# Patient Record
Sex: Male | Born: 2000 | Race: White | Hispanic: No | Marital: Single | Smoking: Never smoker
Health system: Southern US, Community
[De-identification: ages and names within clinical notes are randomized; demographics above are authoritative.]

## PROBLEM LIST (undated history)

## (undated) DIAGNOSIS — J45909 Unspecified asthma, uncomplicated: Secondary | ICD-10-CM

## (undated) DIAGNOSIS — L309 Dermatitis, unspecified: Secondary | ICD-10-CM

## (undated) HISTORY — DX: Dermatitis, unspecified: L30.9

---
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
## 2019-01-24 ENCOUNTER — Other Ambulatory Visit: Payer: Self-pay | Admitting: Nurse Practitioner

## 2019-01-24 DIAGNOSIS — R0989 Other specified symptoms and signs involving the circulatory and respiratory systems: Secondary | ICD-10-CM

## 2019-02-08 ENCOUNTER — Ambulatory Visit
Admission: RE | Admit: 2019-02-08 | Discharge: 2019-02-08 | Disposition: A | Discharge status: Home / Self Care | Payer: BC Managed Care – PPO | Source: Ambulatory Visit | Attending: Nurse Practitioner | Admitting: Nurse Practitioner

## 2019-02-08 DIAGNOSIS — R0989 Other specified symptoms and signs involving the circulatory and respiratory systems: Secondary | ICD-10-CM

## 2020-01-12 IMAGING — US SOFT TISSUE ULTRASOUND HEAD/NECK
1 series · 5 of 5 positions shown · non-contrast
Comparison: None.

CLINICAL DATA: Left posterior irregular lymph node, 1 cm

EXAM:
ULTRASOUND OF HEAD/NECK SOFT TISSUES
TECHNIQUE: Ultrasound examination of the head and neck soft tissues was
performed in the area of clinical concern.

[Series 1: soft tissue ultrasound head/neck · 0.05mm/px · 5 acquisitions, 5 frames shown]
[im 1/5]
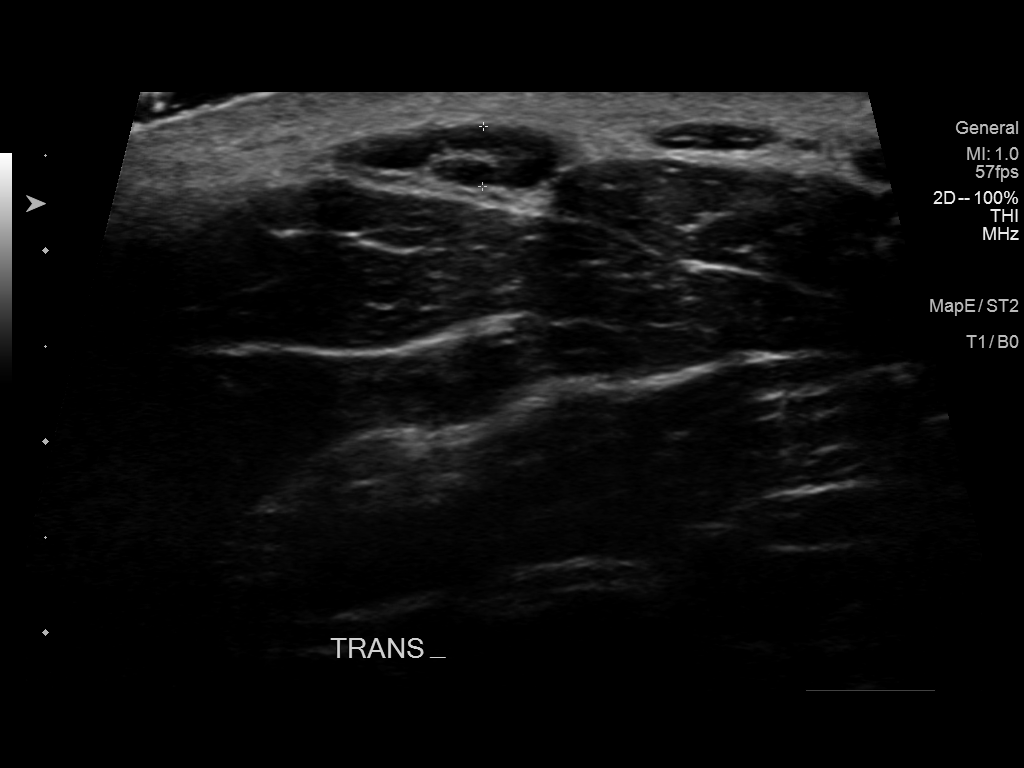
[im 2/5]
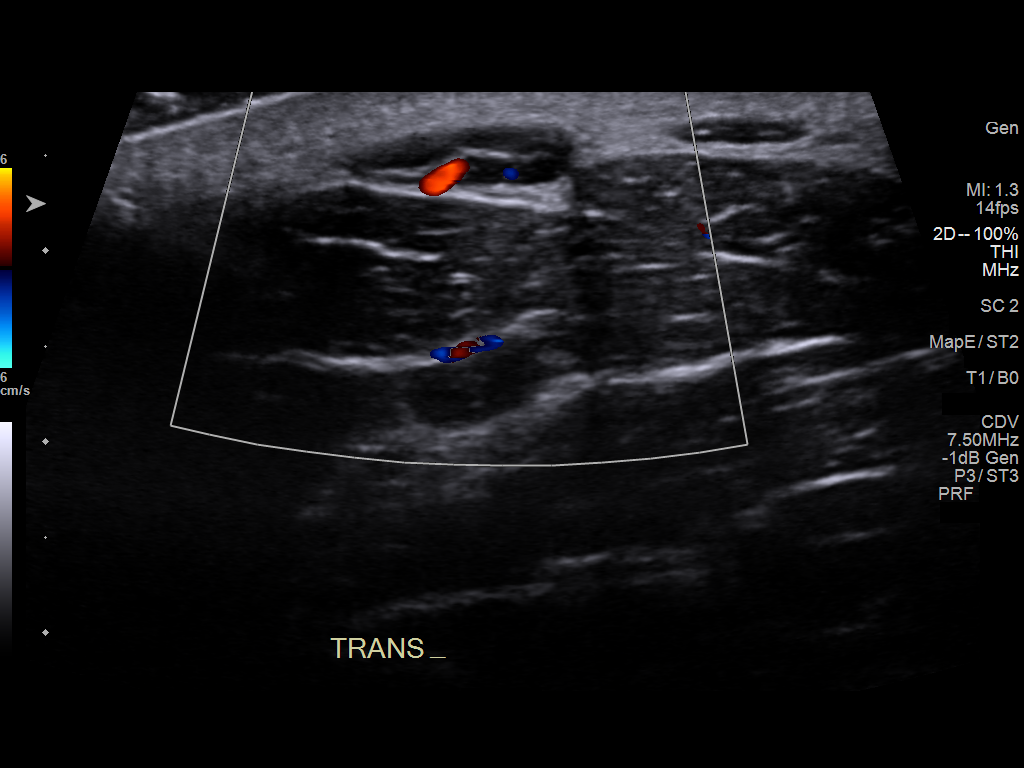
[im 3/5]
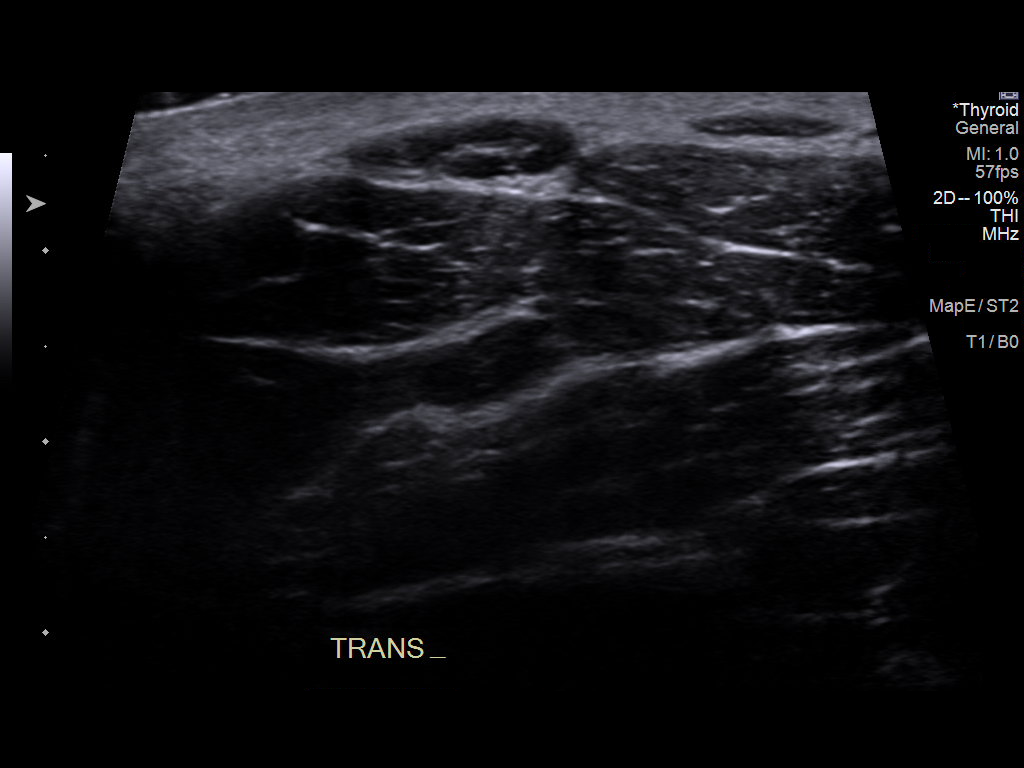
[im 4/5]
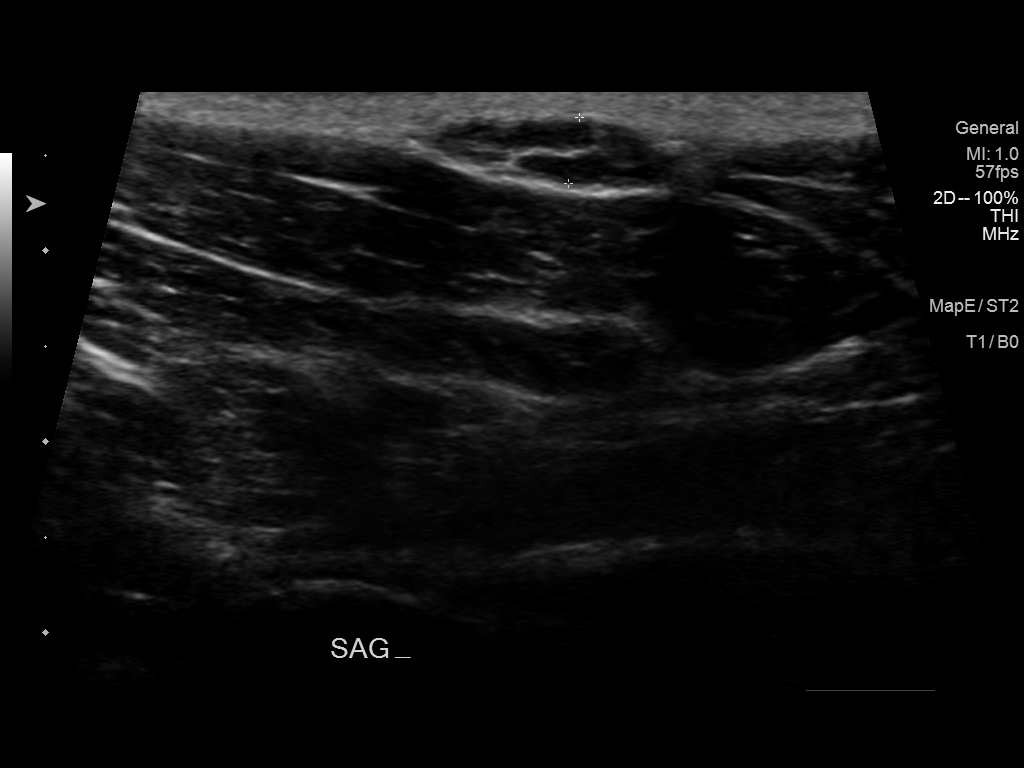
[im 5/5]
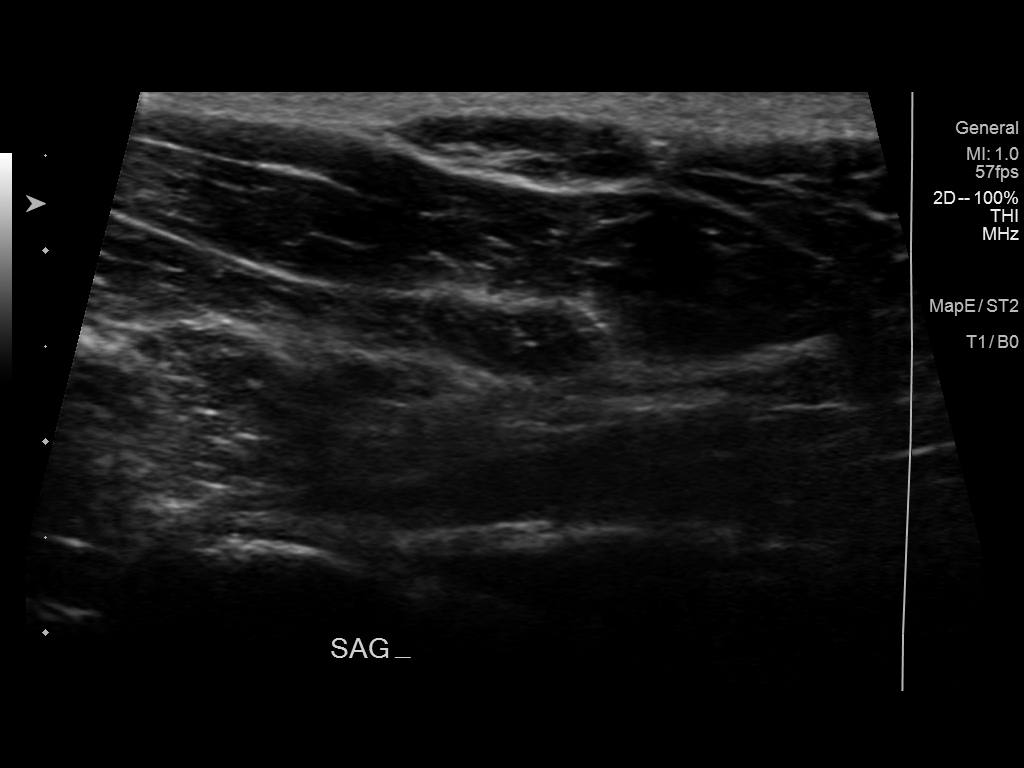

[5 of 5 positions shown; findings below may reference images not displayed]

FINDINGS: Palpable complaint reflects a normal morphology lymph node measuring
3 x 11 mm. No underlying inflammation or collection is seen.
Subjacent musculature and superficial fat is normal appearing.
IMPRESSION: The palpable complaint reflects a normal morphology lymph node
measuring 11 x 3 mm. No adjacent inflammatory changes are noted.

## 2020-09-04 ENCOUNTER — Other Ambulatory Visit: Payer: Self-pay

## 2020-09-04 ENCOUNTER — Ambulatory Visit: Payer: BC Managed Care – PPO | Admitting: Allergy

## 2020-09-04 ENCOUNTER — Encounter: Payer: Self-pay | Admitting: Allergy

## 2020-09-04 VITALS — BP 122/62 | HR 87 | Temp 97.9°F | Resp 18 | Ht 72.05 in | Wt 175.8 lb

## 2020-09-04 DIAGNOSIS — L2089 Other atopic dermatitis: Secondary | ICD-10-CM | POA: Diagnosis not present

## 2020-09-04 DIAGNOSIS — T781XXD Other adverse food reactions, not elsewhere classified, subsequent encounter: Secondary | ICD-10-CM | POA: Diagnosis not present

## 2020-09-04 DIAGNOSIS — J3089 Other allergic rhinitis: Secondary | ICD-10-CM | POA: Diagnosis not present

## 2020-09-04 DIAGNOSIS — Z8709 Personal history of other diseases of the respiratory system: Secondary | ICD-10-CM | POA: Diagnosis not present

## 2020-09-04 MED ORDER — EUCRISA 2 % EX OINT: 1.0000 "application " | TOPICAL_OINTMENT | Freq: Two times a day (BID) | CUTANEOUS | 5 refills | Status: DC

## 2020-09-04 MED ORDER — HYDROXYZINE HCL 10 MG PO TABS: ORAL_TABLET | ORAL | 2 refills | Status: DC

## 2020-09-04 MED ORDER — TRIAMCINOLONE ACETONIDE 0.1 % EX OINT: 1.0000 "application " | TOPICAL_OINTMENT | Freq: Two times a day (BID) | CUTANEOUS | 2 refills | Status: DC

## 2020-09-04 MED ORDER — TRIAMCINOLONE ACETONIDE 0.1 % EX OINT: 1.0000 "application " | TOPICAL_OINTMENT | Freq: Two times a day (BID) | CUTANEOUS | 2 refills | Status: DC | PRN

## 2020-09-04 NOTE — Progress Notes (Unsigned)
New Patient Note  RE: Adrian Lester MRN: [id number] DOB: August 05, 2000 Date of Office Visit: 09/04/2020  Referring provider: Verl Blalock Primary care provider: Marinda Elk, MD  Chief Complaint: Allergy Testing  History of Present Illness: I had the pleasure of seeing [name] for initial evaluation at the [facility] on 09/05/2020. He is a 20 y.o. male, who is referred here by Marinda Elk, MD for the evaluation of rash.  Rash started about 2-3 months ago. Mainly occurs on his armpits, shoulders, face. Describes them as dry skin, itchy, red, flat. No ecchymosis upon resolution. Associated symptoms include: none.  Had one episode of hives at first onset but that resolved after taking prednisone.   Suspected triggers are unknown. Denies any fevers, chills, changes in medications, foods, personal care products or recent infections. He has tried the following therapies: triamcinolone and Claritin with no benefit. Systemic steroids yes x 3 which helps and currently on a prednisone taper. Currently takes Claritin prn.  Currently using Aveeno products.  Previous work up includes: none. Previous history of rash/hives: history of eczema as a child.  Patient moved back home with parents last year and got a new dog during Christmas time as well.   Assessment and Plan: Mayer is a 20 y.o. male with: Other atopic dermatitis History of eczema as a child which flared significantly 2-3 months ago requiring 3 total courses of prednisone. Denies any changes in diet, meds or personal care products. They did get a new dog in December 2021.   Today's skin testing showed: Positive to grass, weed pollen, trees, mold, dust mites, cat, dog, cockroach, horse, ragweed. Borderline to mouse Positive to peanuts, wheat, casein, fish and cashew.  Discussed with patient the above allergens can definitely flare his eczema and oral prednisone is not good long term option.   He  did not notice any flares after consuming wheat, casein or fish - most likely an irrelevant sensitization due to his eczema. Okay to continue to consume.  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    Start Dupixent injections - will start prior authorization.  Let us know when ready to start.  See below for proper skin care - no tide pods for laundry detergent.  Medications: . Only apply to affected areas that are "rough and red" Face: May use Eucrisa (crisaborole) 2% ointment twice a day on mild eczema flares on the face and body. This is a non-steroid ointment. Samples given. If it burns, place the medication in the refrigerator.  Apply a thin layer of moisturizer and then apply the Eucrisa on top of it. Body:  . May use triamcinolone 0.1% ointment twice a day as needed for eczema flares. Do not use on the face, neck, armpits or groin area. Do not use more than 3 weeks in a row.  . Moisturizer: Triamcinolone-Eucerin twice a day. . For more than twice a day use the following: Aquaphor, Vaseline, Cerave, Cetaphil, Eucerin, Vanicream.  Itching: . Take Zyrtec 10mg  in the morning . Take hydroxyzine 10-20mg  1 hour before bed  Other allergic rhinitis Some rhinitis symptoms.  Today's skin testing showed: Positive to grass, weed pollen, trees, mold, dust mites, cat, dog, cockroach, horse, ragweed. Borderline to mouse  Start environmental control measures as below.  May use over the counter antihistamines such as Zyrtec (cetirizine), Claritin (loratadine), Allegra (fexofenadine), or Xyzal (levocetirizine) daily as needed.  Adverse reaction to food, subsequent encounter Throat tightness/closure after  peanut exposure in the past. Avoiding all peanuts and tree nuts. 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  Today's skin testing showed: Positive to peanuts, wheat, casein, fish and cashew.   The wheat, casein and fish are most likely an irrelevant sensitization due to his eczema.  Okay to continue to consume.  Continue strict avoidance of peanuts, tree nuts.   For mild symptoms you can take over the counter antihistamines such as Benadryl and monitor symptoms closely. If symptoms worsen or if you have severe symptoms including breathing issues, throat closure, significant swelling, whole body hives, severe diarrhea and vomiting, lightheadedness then inject epinephrine and seek immediate medical care afterwards.  Food action plan given.   History of asthma Asthma as a child. No inhaler use for the past few years.  Monitor symptoms.  Return in about 2 months (around 11/04/2020).  Meds ordered this encounter  Medications  . Crisaborole (EUCRISA) 2 % OINT    Sig: Apply 1 application topically 2 (two) times daily.    Dispense:  100 g    Refill:  5  . hydrOXYzine (ATARAX/VISTARIL) 10 MG tablet    Sig: Take 1 to 2 tablets 1 hour before bedtime as needed for itching    Dispense:  60 tablet    Refill:  2  . triamcinolone ointment (KENALOG) 0.1 %    Sig: Apply 1 application topically 2 (two) times daily. Use daily as moisturizer    Dispense:  453 g    Refill:  2    Mix 1:1 with Eucerin.  Marland Kitchen triamcinolone ointment (KENALOG) 0.1 %    Sig: Apply 1 application topically 2 (two) times daily as needed (eczema flare on the body). Do not use on the face, neck, armpits or groin area. Do not use more than 3 weeks in a row.    Dispense:  80 g    Refill:  2   Lab Orders  No laboratory test(s) ordered today    Other allergy screening: Asthma: yes as a child but no inhaler use for the past few years Rhino conjunctivitis: yes  Some rhinitis symptoms. Food allergy: yes  Peanuts caused throat closure in the past. Avoiding all peanuts and tree nuts.  Patient has Epipen and no prior use. No recent skin testing.  Patient eats wheat and dairy on a daily basis with no known reactions Eats fish a few times per month with no issues.  Medication allergy: no Hymenoptera allergy:  no History of recurrent infections suggestive of immunodeficency: no  Diagnostics: Skin Testing: Environmental allergy panel and select foods. Positive to grass, weed pollen, trees, mold, dust mites, cat, dog, cockroach, horse, ragweed. Borderline to mouse Positive to peanuts, wheat, casein, fish and cashew. Results discussed with patient/family.  Airborne Adult Perc - 09/04/20 1516    Time Antigen Placed 1516    Location Back    Number of Test 59    1. Control-Buffer 50% Glycerol Negative    2. Control-Histamine 1 mg/ml 2+    3. Albumin saline Negative    4. Bahia 3+    5. [place] 2+    6. Johnson 2+    7. Kentucky Blue 2+    8. Meadow Fescue 2+    9. Perennial Rye 2+    10. Sweet Vernal 2+    11. Timothy 3+    12. Cocklebur Negative    13. Burweed Marshelder Negative    14. Ragweed, short Negative    15. Ragweed, Giant Negative  16. Plantain,  English Negative    17. Lamb's Quarters Negative    18. Sheep Sorrell 2+    19. Rough Pigweed Negative    20. Marsh Elder, Rough Negative    21. Mugwort, Common 2+    22. Ash mix 2+    23. Birch mix 2+    24. Beech American Negative    25. Box, Elder 2+    26. Cedar, red 2+    27. Cottonwood, Guinea-[place]issau Negative    28. Elm mix 2+    29. Hickory 3+    30. Maple mix Negative    31. Oak, [place] mix 3+    32. Pecan Pollen 2+    33. Pine mix Negative    34. Sycamore Eastern 2+    35. Walnut, Black Pollen 2+    36. Alternaria alternata Negative    37. Cladosporium Herbarum 3+    38. Aspergillus mix Negative    39. Penicillium mix Negative    40. Bipolaris sorokiniana (Helminthosporium) Negative    41. Drechslera spicifera (Curvularia) Negative    42. Mucor plumbeus Negative    43. Fusarium moniliforme Negative    44. Aureobasidium pullulans (pullulara) Negative    45. Rhizopus oryzae Negative    46. Botrytis cinera Negative    47. Epicoccum nigrum Negative    48. Phoma betae Negative    49. Candida Albicans Negative     50. Trichophyton mentagrophytes Negative    51. Mite, D Farinae  5,000 AU/ml 4+    52. Mite, D Pteronyssinus  5,000 AU/ml 4+    53. Cat Hair 10,000 BAU/ml 3+    54.  Dog Epithelia 3+    55. Mixed Feathers Negative    56. Horse Epithelia 3+    57. Cockroach, German 2+    58. Mouse --   +/-   59. Tobacco Leaf Negative          Food Perc - 09/04/20 1516      Test Information   Time Antigen Placed 1516    Allergen Manufacturer Waynette Buttery    Location Back    Number of allergen test 10      Food   1. Peanut --   18x11   2. Soybean food Negative    3. Wheat, whole --   12x7   4. Sesame Negative    5. Milk, cow Negative    6. Egg White, chicken Negative    7. Casein --   8x6   8. Shellfish mix Negative    9. Fish mix --   13x8   10. Cashew --   19x9         Intradermal - 09/04/20 1553    Time Antigen Placed 1553    Allergen Manufacturer Waynette Buttery    Location Arm    Number of Test 5    Control Negative    Ragweed mix 2+    Mold 2 2+    Mold 3 Negative    Mold 4 2+           Past Medical History: Patient Active Problem List   Diagnosis Date Noted  . Other atopic dermatitis 09/05/2020  . Other allergic rhinitis 09/05/2020  . History of asthma 09/05/2020  . Adverse reaction to food, subsequent encounter 09/05/2020  . Skull fracture with concussion (HCC) 12/05/2014  . Head trauma 12/05/2014   Past Medical History:  Diagnosis Date  . Asthma    Past Surgical History: History reviewed.  No pertinent surgical history. Medication List:  Current Outpatient Medications  Medication Sig Dispense Refill  . albuterol (PROVENTIL HFA;VENTOLIN HFA) 108 (90 BASE) MCG/ACT inhaler Inhale 1-2 puffs into the lungs every 6 (six) hours as needed for wheezing. 1 Inhaler 1  . cetirizine (ZYRTEC) 10 MG tablet Take 10 mg by mouth daily.    Lennox Solders. Crisaborole (EUCRISA) 2 % OINT Apply 1 application topically 2 (two) times daily. 100 g 5  . EPINEPHrine 0.3 mg/0.3 mL IJ SOAJ injection See admin  instructions.    Marland Kitchen. escitalopram (LEXAPRO) 10 MG tablet Take 10 mg by mouth daily.    . hydrOXYzine (ATARAX/VISTARIL) 10 MG tablet Take 1 to 2 tablets 1 hour before bedtime as needed for itching 60 tablet 2  . ibuprofen (ADVIL,MOTRIN) 200 MG tablet Take 200 mg by mouth every 6 (six) hours as needed for fever or moderate pain.    . predniSONE (DELTASONE) 10 MG tablet Take by mouth.    . triamcinolone 0.1% oint-Eucerin equivalent cream 1:1 mixture See admin instructions.    . triamcinolone ointment (KENALOG) 0.1 % Apply 1 application topically 2 (two) times daily. Use daily as moisturizer 453 g 2  . triamcinolone ointment (KENALOG) 0.1 % Apply 1 application topically 2 (two) times daily as needed (eczema flare on the body). Do not use on the face, neck, armpits or groin area. Do not use more than 3 weeks in a row. 80 g 2   No current facility-administered medications for this visit.   Allergies: Allergies  Allergen Reactions  . Peanut-Containing Drug Products    Social History: Social History   Socioeconomic History  . Marital status: Single    Spouse name: Not on file  . Number of children: Not on file  . Years of education: Not on file  . Highest education level: Not on file  Occupational History  . Not on file  Tobacco Use  . Smoking status: Never Smoker  . Smokeless tobacco: Never Used  Substance and Sexual Activity  . Alcohol use: Not on file  . Drug use: Never  . Sexual activity: Not on file  Other Topics Concern  . Not on file  Social History Narrative  . Not on file   Social Determinants of Health   Financial Resource Strain: Not on file  Food Insecurity: Not on file  Transportation Needs: Not on file  Physical Activity: Not on file  Stress: Not on file  Social Connections: Not on file   Lives in a 21 year old house. Smoking: denies Occupation: Facilities managerwaiter  Environmental HistorySurveyor, minerals: Water Damage/mildew in the house: no Engineer, civil (consulting)Carpet in the family room: yes Carpet in the  bedroom: yes Heating: gas Cooling: central Pet: yes 2 dogs x 6 yrs, x few months, 1 cat x 7 yrs  Family History: Family History  Problem Relation Age of Onset  . Hypertension Father   . Heart disease Maternal Grandfather   . Hypertension Maternal Grandfather   . Heart disease Paternal Grandfather   . Hypertension Paternal Grandfather    Problem                               Relation Asthma                                   Mother, brother  Eczema  Mother  Food allergy                          no Allergic rhino conjunctivitis     no  Review of Systems  Constitutional: Negative for appetite change, chills, fever and unexpected weight change.  HENT: Negative for congestion and rhinorrhea.   Eyes: Negative for itching.  Respiratory: Negative for cough, chest tightness, shortness of breath and wheezing.   Cardiovascular: Negative for chest pain.  Gastrointestinal: Negative for abdominal pain.  Genitourinary: Negative for difficulty urinating.  Skin: Positive for rash.  Allergic/Immunologic: Positive for environmental allergies and food allergies.  Neurological: Negative for headaches.   Objective: BP 122/62 (BP Location: Right Arm, Patient Position: Sitting, Cuff Size: Normal)   Pulse 87   Temp 97.9 F (36.6 C) (Temporal)   Resp 18   Ht 6' 0.05" (1.83 m)   Wt 175 lb 12.8 oz (79.7 kg)   SpO2 97%   BMI 23.81 kg/m  Body mass index is 23.81 kg/m. Physical Exam Vitals and nursing note reviewed.  Constitutional:      Appearance: Normal appearance. He is well-developed.  HENT:     Head: Normocephalic and atraumatic.     Right Ear: Tympanic membrane and external ear normal.     Left Ear: Tympanic membrane and external ear normal.     Nose: Nose normal.     Mouth/Throat:     Mouth: Mucous membranes are moist.     Pharynx: Oropharynx is clear.  Eyes:     Conjunctiva/sclera: Conjunctivae normal.  Cardiovascular:     Rate and Rhythm: Normal  rate and regular rhythm.     Heart sounds: Normal heart sounds. No murmur heard. No friction rub. No gallop.   Pulmonary:     Effort: Pulmonary effort is normal.     Breath sounds: Normal breath sounds. No wheezing, rhonchi or rales.  Musculoskeletal:     Cervical back: Neck supple.  Skin:    General: Skin is warm and dry.     Findings: Rash present.     Comments: Very dry skin throughout with excoriation marks. Periorbital erythema b/l.  Neurological:     Mental Status: He is alert and oriented to person, place, and time.  Psychiatric:        Behavior: Behavior normal.    The plan was reviewed with the patient/family, and all questions/concerned were addressed.  It was my pleasure to see Adrian Lester today and participate in his care. Please feel free to contact me with any questions or concerns.  Sincerely,  Wyline Mood, DO Allergy & Immunology  [facility] office: [phone] [facility] office: [phone]

## 2020-09-04 NOTE — Patient Instructions (Addendum)
Today's skin testing showed: Positive to grass, weed pollen, trees, mold, dust mites, cat, dog, cockroach, horse, ragweed. Borderline to mouse Positive to peanuts, wheat, casein, fish and cashew.  Skin:   Start Dupixent injections - will start prior authorization.  Let us know when ready to start.  See below for proper skin care. Medications: . Only apply to affected areas that are "rough and red" Face: May use Eucrisa (crisaborole) 2% ointment twice a day on mild eczema flares on the face and body. This is a non-steroid ointment.  If it burns, place the medication in the refrigerator.  Apply a thin layer of moisturizer and then apply the Eucrisa on top of it. Body:  . May use triamcinolone 0.1% ointment twice a day as needed for eczema flares. Do not use on the face, neck, armpits or groin area. Do not use more than 3 weeks in a row.  . Moisturizer: Triamcinolone-Eucerin twice a day. . For more than twice a day use the following: Aquaphor, Vaseline, Cerave, Cetaphil, Eucerin, Vanicream.  Itching: . Take Zyrtec 10mg  in the morning . Take hydroxyzine 10-20mg  1 hour before bed  Environmental allergies  Start environmental control measures as below.  May use over the counter antihistamines such as Zyrtec (cetirizine), Claritin (loratadine), Allegra (fexofenadine), or Xyzal (levocetirizine) daily as needed.  Food:  Continue strict avoidance of peanuts, tree nuts.   For mild symptoms you can take over the counter antihistamines such as Benadryl and monitor symptoms closely. If symptoms worsen or if you have severe symptoms including breathing issues, throat closure, significant swelling, whole body hives, severe diarrhea and vomiting, lightheadedness then inject epinephrine and seek immediate medical care afterwards.  Food action plan given.   Follow up in 2 months or sooner if needed.    Skin care recommendations  Bath time: . Always use lukewarm water. AVOID very hot or cold  water. Keep bathing time to 5-10 minutes. . Do NOT use bubble bath. . Use a mild soap and use just enough to wash the dirty areas. . Do NOT scrub skin vigorously.  . After bathing, pat dry your skin with a towel. Do NOT rub or scrub the skin.  Moisturizers and prescriptions:  . ALWAYS apply moisturizers immediately after bathing (within 3 minutes). This helps to lock-in moisture. . Use the moisturizer several times a day over the whole body. Marland Kitchen summer moisturizers include: Aveeno, CeraVe, Cetaphil. Peri Jefferson winter moisturizers include: Aquaphor, Vaseline, Cerave, Cetaphil, Eucerin, Vanicream. . When using moisturizers along with medications, the moisturizer should be applied about one hour after applying the medication to prevent diluting effect of the medication or moisturize around where you applied the medications. When not using medications, the moisturizer can be continued twice daily as maintenance.  Laundry and clothing: . Avoid laundry products with added color or perfumes. . Use unscented hypo-allergenic laundry products such as Tide free, Cheer free & gentle, and All free and clear.  . If the skin still seems dry or sensitive, you can try double-rinsing the clothes. . Avoid tight or scratchy clothing such as wool. . Do not use fabric softeners or dyer sheets.  Reducing Pollen Exposure . Pollen seasons: trees (spring), grass (summer) and ragweed/weeds (fall). 11-06-1981 Keep windows closed in your home and car to lower pollen exposure.  Marland Kitchen air conditioning in the bedroom and throughout the house if possible.  . Avoid going out in dry windy days - especially early morning. . Pollen counts are highest between  5 - 10 AM and on dry, hot and windy days.  . Save outside activities for late afternoon or after a heavy rain, when pollen levels are lower.  . Avoid mowing of grass if you have grass pollen allergy. Marland Kitchen Be aware that pollen can also be transported indoors on people and pets.   . Dry your clothes in an automatic dryer rather than hanging them outside where they might collect pollen.  . Rinse hair and eyes before bedtime. Mold Control . Mold and fungi can grow on a variety of surfaces provided certain temperature and moisture conditions exist.  . Outdoor molds grow on plants, decaying vegetation and soil. The major outdoor mold, Alternaria and Cladosporium, are found in very high numbers during hot and dry conditions. Generally, a late summer - fall peak is seen for common outdoor fungal spores. Rain will temporarily lower outdoor mold spore count, but counts rise rapidly when the rainy period ends. . The most important indoor molds are Aspergillus and Penicillium. Dark, humid and poorly ventilated basements are ideal sites for mold growth. The next most common sites of mold growth are the bathroom and the kitchen. Outdoor (Seasonal) Mold Control . Use air conditioning and keep windows closed. . Avoid exposure to decaying vegetation. Marland Kitchen Avoid leaf raking. . Avoid grain handling. . Consider wearing a face mask if working in moldy areas.  Indoor (Perennial) Mold Control  . Maintain humidity below 50%. . Get rid of mold growth on hard surfaces with water, detergent and, if necessary, 5% bleach (do not mix with other cleaners). Then dry the area completely. If mold covers an area more than 10 square feet, consider hiring an indoor environmental professional. . For clothing, washing with soap and water is best. If moldy items cannot be cleaned and dried, throw them away. . Remove sources e.g. contaminated carpets. . Repair and seal leaking roofs or pipes. Using dehumidifiers in damp basements may be helpful, but empty the water and clean units regularly to prevent mildew from forming. All rooms, especially basements, bathrooms and kitchens, require ventilation and cleaning to deter mold and mildew growth. Avoid carpeting on concrete or damp floors, and storing items in damp  areas. Control of House Dust Mite Allergen . Dust mite allergens are a common trigger of allergy and asthma symptoms. While they can be found throughout the house, these microscopic creatures thrive in warm, humid environments such as bedding, upholstered furniture and carpeting. . Because so much time is spent in the bedroom, it is essential to reduce mite levels there.  . Encase pillows, mattresses, and box springs in special allergen-proof fabric covers or airtight, zippered plastic covers.  . Bedding should be washed weekly in hot water (130 F) and dried in a hot dryer. Allergen-proof covers are available for comforters and pillows that can't be regularly washed.  Reyes Ivan the allergy-proof covers every few months. Minimize clutter in the bedroom. Keep pets out of the bedroom.  Marland Kitchen Keep humidity less than 50% by using a dehumidifier or air conditioning. You can buy a humidity measuring device called a hygrometer to monitor this.  . If possible, replace carpets with hardwood, linoleum, or washable area rugs. If that's not possible, vacuum frequently with a vacuum that has a HEPA filter. . Remove all upholstered furniture and non-washable window drapes from the bedroom. . Remove all non-washable stuffed toys from the bedroom.  Wash stuffed toys weekly. Pet Allergen Avoidance: . Contrary to popular opinion, there are no "hypoallergenic" breeds  of dogs or cats. That is because people are not allergic to an animal's hair, but to an allergen found in the animal's saliva, dander (dead skin flakes) or urine. Pet allergy symptoms typically occur within minutes. For some people, symptoms can build up and become most severe 8 to 12 hours after contact with the animal. People with severe allergies can experience reactions in public places if dander has been transported on the pet owners' clothing. Marland Kitchen Keeping an animal outdoors is only a partial solution, since homes with pets in the yard still have higher  concentrations of animal allergens. . Before getting a pet, ask your allergist to determine if you are allergic to animals. If your pet is already considered part of your family, try to minimize contact and keep the pet out of the bedroom and other rooms where you spend a great deal of time. . As with dust mites, vacuum carpets often or replace carpet with a hardwood floor, tile or linoleum. . High-efficiency particulate air (HEPA) cleaners can reduce allergen levels over time. . While dander and saliva are the source of cat and dog allergens, urine is the source of allergens from rabbits, hamsters, mice and [place] pigs; so ask a non-allergic family member to clean the animal's cage. . If you have a pet allergy, talk to your allergist about the potential for allergy immunotherapy (allergy shots). This strategy can often provide long-term relief. Cockroach Allergen Avoidance Cockroaches are often found in the homes of densely populated urban areas, schools or commercial buildings, but these creatures can lurk almost anywhere. This does not mean that you have a dirty house or living area. . Block all areas where roaches can enter the home. This includes crevices, wall cracks and windows.  . Cockroaches need water to survive, so fix and seal all leaky faucets and pipes. Have an exterminator go through the house when your family and pets are gone to eliminate any remaining roaches. Marland Kitchen Keep food in lidded containers and put pet food dishes away after your pets are done eating. Vacuum and sweep the floor after meals, and take out garbage and recyclables. Use lidded garbage containers in the kitchen. Wash dishes immediately after use and clean under stoves, refrigerators or toasters where crumbs can accumulate. Wipe off the stove and other kitchen surfaces and cupboards regularly.

## 2020-09-05 ENCOUNTER — Encounter: Payer: Self-pay | Admitting: Allergy

## 2020-09-05 DIAGNOSIS — Z8709 Personal history of other diseases of the respiratory system: Secondary | ICD-10-CM | POA: Insufficient documentation

## 2020-09-05 DIAGNOSIS — J302 Other seasonal allergic rhinitis: Secondary | ICD-10-CM | POA: Insufficient documentation

## 2020-09-05 DIAGNOSIS — L2089 Other atopic dermatitis: Secondary | ICD-10-CM | POA: Insufficient documentation

## 2020-09-05 DIAGNOSIS — T7819XD Other adverse food reactions, not elsewhere classified, subsequent encounter: Secondary | ICD-10-CM | POA: Insufficient documentation

## 2020-09-05 DIAGNOSIS — J3089 Other allergic rhinitis: Secondary | ICD-10-CM | POA: Insufficient documentation

## 2020-09-05 DIAGNOSIS — T781XXD Other adverse food reactions, not elsewhere classified, subsequent encounter: Secondary | ICD-10-CM | POA: Insufficient documentation

## 2020-09-05 NOTE — Assessment & Plan Note (Signed)
Some rhinitis symptoms.  Today's skin testing showed: Positive to grass, weed pollen, trees, mold, dust mites, cat, dog, cockroach, horse, ragweed. Borderline to mouse  Start environmental control measures as below.  May use over the counter antihistamines such as Zyrtec (cetirizine), Claritin (loratadine), Allegra (fexofenadine), or Xyzal (levocetirizine) daily as needed.

## 2020-09-05 NOTE — Assessment & Plan Note (Signed)
Asthma as a child. No inhaler use for the past few years.  Monitor symptoms.

## 2020-09-05 NOTE — Assessment & Plan Note (Addendum)
Throat tightness/closure after peanut exposure in the past. Avoiding all peanuts and tree nuts. 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  Today's skin testing showed: Positive to peanuts, wheat, casein, fish and cashew.   The wheat, casein and fish are most likely an irrelevant sensitization due to his eczema. Okay to continue to consume.  Continue strict avoidance of peanuts, tree nuts.   For mild symptoms you can take over the counter antihistamines such as Benadryl and monitor symptoms closely. If symptoms worsen or if you have severe symptoms including breathing issues, throat closure, significant swelling, whole body hives, severe diarrhea and vomiting, lightheadedness then inject epinephrine and seek immediate medical care afterwards.  Food action plan given.

## 2020-09-05 NOTE — Assessment & Plan Note (Signed)
History of eczema as a child which flared significantly 2-3 months ago requiring 3 total courses of prednisone. Denies any changes in diet, meds or personal care products. They did get a new dog in December 2021.   Today's skin testing showed: Positive to grass, weed pollen, trees, mold, dust mites, cat, dog, cockroach, horse, ragweed. Borderline to mouse Positive to peanuts, wheat, casein, fish and cashew.  Discussed with patient the above allergens can definitely flare his eczema and oral prednisone is not good long term option.   He did not notice any flares after consuming wheat, casein or fish - most likely an irrelevant sensitization due to his eczema. Okay to continue to consume.  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    Start Dupixent injections - will start prior authorization.  Let us know when ready to start.  See below for proper skin care - no tide pods for laundry detergent.  Medications: . Only apply to affected areas that are "rough and red" Face: May use Eucrisa (crisaborole) 2% ointment twice a day on mild eczema flares on the face and body. This is a non-steroid ointment. Samples given. If it burns, place the medication in the refrigerator.  Apply a thin layer of moisturizer and then apply the Eucrisa on top of it. Body:  . May use triamcinolone 0.1% ointment twice a day as needed for eczema flares. Do not use on the face, neck, armpits or groin area. Do not use more than 3 weeks in a row.  . Moisturizer: Triamcinolone-Eucerin twice a day. . For more than twice a day use the following: Aquaphor, Vaseline, Cerave, Cetaphil, Eucerin, Vanicream.  Itching: . Take Zyrtec 10mg  in the morning . Take hydroxyzine 10-20mg  1 hour before bed

## 2020-09-06 ENCOUNTER — Telehealth: Payer: Self-pay | Admitting: *Deleted

## 2020-09-06 MED ORDER — PIMECROLIMUS 1 % EX CREA: TOPICAL_CREAM | Freq: Two times a day (BID) | CUTANEOUS | 1 refills | Status: DC | PRN

## 2020-09-06 NOTE — Telephone Encounter (Signed)
Spoke with mom, informed her of Dr. Elmyra Ricks recommendation. Mom verbalized understanding and asked if we could send the medication to [facility]. Script was sent to the requested pharmacy.

## 2020-09-06 NOTE — Telephone Encounter (Signed)
Patients Ins has denied Dupixent due to no trial of calcineurin inhibitor within the last six months. Please advised

## 2020-09-06 NOTE — Addendum Note (Signed)
Addended by: Dub Mikes on: 09/06/2020 02:54 PM   Modules accepted: Orders

## 2020-09-06 NOTE — Telephone Encounter (Signed)
Please call patient and let them know that dupixent PA was denied because of this and that I'm sending Rx for Elidel - to use for the eczema flare.  Still keep the May appointment to discuss further.

## 2020-09-06 NOTE — Telephone Encounter (Signed)
-----   Message from Adrian Sia, DO sent at 09/04/2020  5:24 PM EST ----- Please start PA for Dupixent for eczema - 600mg  loading dose and 300mg  every 2 weeks. Sample dose was NOT given today as patient going to check with parents about starting first.

## 2020-09-11 ENCOUNTER — Telehealth: Payer: Self-pay

## 2020-09-11 NOTE — Telephone Encounter (Signed)
Submitted prior authorization for pimecrolimus 1% cream (generic Elidel) on [email or web address].  Awaiting response.  

## 2020-09-13 NOTE — Telephone Encounter (Signed)
Notice of approval for Pimecrolimus 1% cream has been received. It states "As long as patient remains covered by the [facility] and there is no changes to the patients plan benefits, this request is approved from 09/11/2020- 09/12/2023". Approval has been faxed to the pharmacy. Mom has been made aware. She did state that patient knows he cant use certain creams around his eyes when he breaks out and is wondering if this cream is one he can use around his eyes. Per Dr. Selena Batten yes patient may use pimecrolimus around eyes. Patient will try the cream and let us know how it is doing at his appointment in May.

## 2020-11-06 ENCOUNTER — Other Ambulatory Visit: Payer: Self-pay

## 2020-11-06 ENCOUNTER — Ambulatory Visit: Payer: BC Managed Care – PPO | Admitting: Allergy

## 2020-11-06 ENCOUNTER — Encounter: Payer: Self-pay | Admitting: Allergy

## 2020-11-06 VITALS — BP 102/74 | HR 118 | Temp 98.0°F | Resp 16

## 2020-11-06 DIAGNOSIS — J3089 Other allergic rhinitis: Secondary | ICD-10-CM | POA: Diagnosis not present

## 2020-11-06 DIAGNOSIS — J302 Other seasonal allergic rhinitis: Secondary | ICD-10-CM

## 2020-11-06 DIAGNOSIS — T7809XD Anaphylactic reaction due to other food products, subsequent encounter: Secondary | ICD-10-CM

## 2020-11-06 DIAGNOSIS — Z8709 Personal history of other diseases of the respiratory system: Secondary | ICD-10-CM

## 2020-11-06 DIAGNOSIS — L2089 Other atopic dermatitis: Secondary | ICD-10-CM | POA: Diagnosis not present

## 2020-11-06 MED ORDER — FLUTICASONE PROPIONATE 50 MCG/ACT NA SUSP: 1.0000 | Freq: Two times a day (BID) | NASAL | 5 refills | Status: AC | PRN

## 2020-11-06 NOTE — Assessment & Plan Note (Signed)
Past history - Asthma as a child. No inhaler use for the past few years.  Monitor symptoms.

## 2020-11-06 NOTE — Patient Instructions (Addendum)
Skin:   Continue proper skin care. Medications: . Only apply to affected areas that are "rough and red" Face: May use pimecrolimus (Elidel) twice a day on mild eczema flares on the face and body. This is a non-steroid ointment.  Body:  . May use triamcinolone 0.1% ointment twice a day as needed for eczema flares. Do not use on the face, neck, armpits or groin area. Do not use more than 3 weeks in a row.  . Moisturizer: Triamcinolone-Eucerin  o Try to use every other day and see if that controls symptoms.  . For more than twice a day use the following: Aquaphor, Vaseline, Cerave, Cetaphil, Eucerin, Vanicream.  Itching: . Take loratadine 10mg  in the morning . Take hydroxyzine 10-20mg  1 hour before bed  Environmental allergies  2022 skin testing positive to grass, weed pollen, trees, mold, dust mites, cat, dog, cockroach, horse, ragweed. Borderline to mouse  Continue environmental control measures as below.  May use over the counter antihistamines such as Zyrtec (cetirizine), Claritin (loratadine), Allegra (fexofenadine), or Xyzal (levocetirizine) daily as needed.  May use Flonase (fluticasone) nasal spray 1 spray per nostril twice a day as needed for nasal congestion.   Food:  2022 skin testing positive to peanuts, wheat, casein, fish and cashew.  Okay to eat wheat and fish as before.   Continue strict avoidance of peanuts, tree nuts.   For mild symptoms you can take over the counter antihistamines such as Benadryl and monitor symptoms closely. If symptoms worsen or if you have severe symptoms including breathing issues, throat closure, significant swelling, whole body hives, severe diarrhea and vomiting, lightheadedness then inject epinephrine and seek immediate medical care afterwards.  Food action plan in place.   Follow up in 6 months or sooner if needed.    Skin care recommendations  Bath time: . Always use lukewarm water. AVOID very hot or cold water. 2023 Keep bathing  time to 5-10 minutes. . Do NOT use bubble bath. . Use a mild soap and use just enough to wash the dirty areas. . Do NOT scrub skin vigorously.  . After bathing, pat dry your skin with a towel. Do NOT rub or scrub the skin.  Moisturizers and prescriptions:  . ALWAYS apply moisturizers immediately after bathing (within 3 minutes). This helps to lock-in moisture. . Use the moisturizer several times a day over the whole body. Marland Kitchen summer moisturizers include: Aveeno, CeraVe, Cetaphil. Peri [place] winter moisturizers include: Aquaphor, Vaseline, Cerave, Cetaphil, Eucerin, Vanicream. . When using moisturizers along with medications, the moisturizer should be applied about one hour after applying the medication to prevent diluting effect of the medication or moisturize around where you applied the medications. When not using medications, the moisturizer can be continued twice daily as maintenance.  Laundry and clothing: . Avoid laundry products with added color or perfumes. . Use unscented hypo-allergenic laundry products such as Tide free, Cheer free & gentle, and All free and clear.  . If the skin still seems dry or sensitive, you can try double-rinsing the clothes. . Avoid tight or scratchy clothing such as wool. . Do not use fabric softeners or dyer sheets.  Reducing Pollen Exposure . Pollen seasons: trees (spring), grass (summer) and ragweed/weeds (fall). 11-06-1981 Keep windows closed in your home and car to lower pollen exposure.  Marland Kitchen air conditioning in the bedroom and throughout the house if possible.  . Avoid going out in dry windy days - especially early morning. . Pollen counts are highest between  5 - 10 AM and on dry, hot and windy days.  . Save outside activities for late afternoon or after a heavy rain, when pollen levels are lower.  . Avoid mowing of grass if you have grass pollen allergy. Marland Kitchen Be aware that pollen can also be transported indoors on people and pets.  . Dry your clothes  in an automatic dryer rather than hanging them outside where they might collect pollen.  . Rinse hair and eyes before bedtime. Mold Control . Mold and fungi can grow on a variety of surfaces provided certain temperature and moisture conditions exist.  . Outdoor molds grow on plants, decaying vegetation and soil. The major outdoor mold, Alternaria and Cladosporium, are found in very high numbers during hot and dry conditions. Generally, a late summer - fall peak is seen for common outdoor fungal spores. Rain will temporarily lower outdoor mold spore count, but counts rise rapidly when the rainy period ends. . The most important indoor molds are Aspergillus and Penicillium. Dark, humid and poorly ventilated basements are ideal sites for mold growth. The next most common sites of mold growth are the bathroom and the kitchen. Outdoor (Seasonal) Mold Control . Use air conditioning and keep windows closed. . Avoid exposure to decaying vegetation. Marland Kitchen Avoid leaf raking. . Avoid grain handling. . Consider wearing a face mask if working in moldy areas.  Indoor (Perennial) Mold Control  . Maintain humidity below 50%. . Get rid of mold growth on hard surfaces with water, detergent and, if necessary, 5% bleach (do not mix with other cleaners). Then dry the area completely. If mold covers an area more than 10 square feet, consider hiring an indoor environmental professional. . For clothing, washing with soap and water is best. If moldy items cannot be cleaned and dried, throw them away. . Remove sources e.g. contaminated carpets. . Repair and seal leaking roofs or pipes. Using dehumidifiers in damp basements may be helpful, but empty the water and clean units regularly to prevent mildew from forming. All rooms, especially basements, bathrooms and kitchens, require ventilation and cleaning to deter mold and mildew growth. Avoid carpeting on concrete or damp floors, and storing items in damp areas. Control of House  Dust Mite Allergen . Dust mite allergens are a common trigger of allergy and asthma symptoms. While they can be found throughout the house, these microscopic creatures thrive in warm, humid environments such as bedding, upholstered furniture and carpeting. . Because so much time is spent in the bedroom, it is essential to reduce mite levels there.  . Encase pillows, mattresses, and box springs in special allergen-proof fabric covers or airtight, zippered plastic covers.  . Bedding should be washed weekly in hot water (130 F) and dried in a hot dryer. Allergen-proof covers are available for comforters and pillows that can't be regularly washed.  Reyes Ivan the allergy-proof covers every few months. Minimize clutter in the bedroom. Keep pets out of the bedroom.  Marland Kitchen Keep humidity less than 50% by using a dehumidifier or air conditioning. You can buy a humidity measuring device called a hygrometer to monitor this.  . If possible, replace carpets with hardwood, linoleum, or washable area rugs. If that's not possible, vacuum frequently with a vacuum that has a HEPA filter. . Remove all upholstered furniture and non-washable window drapes from the bedroom. . Remove all non-washable stuffed toys from the bedroom.  Wash stuffed toys weekly. Pet Allergen Avoidance: . Contrary to popular opinion, there are no "hypoallergenic" breeds  of dogs or cats. That is because people are not allergic to an animal's hair, but to an allergen found in the animal's saliva, dander (dead skin flakes) or urine. Pet allergy symptoms typically occur within minutes. For some people, symptoms can build up and become most severe 8 to 12 hours after contact with the animal. People with severe allergies can experience reactions in public places if dander has been transported on the pet owners' clothing. Marland Kitchen Keeping an animal outdoors is only a partial solution, since homes with pets in the yard still have higher concentrations of animal  allergens. . Before getting a pet, ask your allergist to determine if you are allergic to animals. If your pet is already considered part of your family, try to minimize contact and keep the pet out of the bedroom and other rooms where you spend a great deal of time. . As with dust mites, vacuum carpets often or replace carpet with a hardwood floor, tile or linoleum. . High-efficiency particulate air (HEPA) cleaners can reduce allergen levels over time. . While dander and saliva are the source of cat and dog allergens, urine is the source of allergens from rabbits, hamsters, mice and [place] pigs; so ask a non-allergic family member to clean the animal's cage. . If you have a pet allergy, talk to your allergist about the potential for allergy immunotherapy (allergy shots). This strategy can often provide long-term relief. Cockroach Allergen Avoidance Cockroaches are often found in the homes of densely populated urban areas, schools or commercial buildings, but these creatures can lurk almost anywhere. This does not mean that you have a dirty house or living area. . Block all areas where roaches can enter the home. This includes crevices, wall cracks and windows.  . Cockroaches need water to survive, so fix and seal all leaky faucets and pipes. Have an exterminator go through the house when your family and pets are gone to eliminate any remaining roaches. Marland Kitchen Keep food in lidded containers and put pet food dishes away after your pets are done eating. Vacuum and sweep the floor after meals, and take out garbage and recyclables. Use lidded garbage containers in the kitchen. Wash dishes immediately after use and clean under stoves, refrigerators or toasters where crumbs can accumulate. Wipe off the stove and other kitchen surfaces and cupboards regularly.

## 2020-11-06 NOTE — Assessment & Plan Note (Addendum)
Past history - eczema as a child which flared significantly 2-3 months ago requiring 3 total courses of prednisone. Denies any changes in diet, meds or personal care products. They did get a new dog in December 2021.  Interim history - much improved with below regimen. Did not try Dupixent due to insurance issues.   Continue proper skin care. Will hold off starting Dupixent.  Medications: . Only apply to affected areas that are "rough and red" Face: May use pimecrolimus (Elidel) twice a day on mild eczema flares on the face and body. This is a non-steroid ointment.  Body:  . May use triamcinolone 0.1% ointment twice a day as needed for eczema flares. Do not use on the face, neck, armpits or groin area. Do not use more than 3 weeks in a row.  . Moisturizer: Triamcinolone-Eucerin  o Try to use every other day and see if that controls symptoms.  . For more than twice a day use the following: Aquaphor, Vaseline, Cerave, Cetaphil, Eucerin, Vanicream.  Itching: . Take loratadine 10mg  in the morning . Take hydroxyzine 10-20mg  1 hour before bed

## 2020-11-06 NOTE — Assessment & Plan Note (Signed)
Past history - 2022 skin testing showed: Positive to grass, weed pollen, trees, mold, dust mites, cat, dog, cockroach, horse, ragweed. Borderline to mouse. Interim history - rhinorrhea.  Continue environmental control measures as below.  May use over the counter antihistamines such as Zyrtec (cetirizine), Claritin (loratadine), Allegra (fexofenadine), or Xyzal (levocetirizine) daily as needed.  May use Flonase (fluticasone) nasal spray 1 spray per nostril twice a day as needed for nasal congestion.

## 2020-11-06 NOTE — Progress Notes (Signed)
Follow Up Note  RE: Adrian Lester MRN: [id number] DOB: 2000/11/19 Date of Office Visit: 11/06/2020  Referring provider: Marinda Elk, MD Primary care provider: Pcp, No  Chief Complaint: Eczema  History of Present Illness: I had the pleasure of seeing Adrian Lester for a follow up visit at the [facility] on 11/06/2020. He is a 20 y.o. male, who is being followed for atopic dermatitis, allergic rhinitis and food allergy. His previous allergy office visit was on 09/04/2020 with Dr. Selena Batten. Today is a regular follow up visit.  Atopic dermatitis Currently using triamcinolone with moisturizer mix after showers daily which has been helping. For the face he has been using Elidel once a day with good benefit. Not using Eucrisa.  Skin is significantly improved but still has areas on the arms, legs, face. He noted improvement within a few weeks of changing his skin care regimen.  Takes hydroxyzine only as needed.  Still taking Claritin daily.  Last course of prednisone was in March.   Other allergic rhinitis Taking Claritin daily and having some rhinorrhea symptoms.   Food allergy Avoiding peanuts, tree nuts with no reactions. Tolerates fish and gluten without any issues.   History of asthma Denies any SOB, coughing, wheezing, chest tightness, nocturnal awakenings, ER/urgent care visits or prednisone use since the last visit.  Assessment and Plan: [name] is a 20 y.o. male with: Other atopic dermatitis Past history - eczema as a child which flared significantly 2-3 months ago requiring 3 total courses of prednisone. Denies any changes in diet, meds or personal care products. They did get a new dog in December 2021.  Interim history - much improved with below regimen. Did not try Dupixent due to insurance issues.   Continue proper skin care. Will hold off starting Dupixent.  Medications: . Only apply to affected areas that are "rough and red" Face: May use  pimecrolimus (Elidel) twice a day on mild eczema flares on the face and body. This is a non-steroid ointment.  Body:  . May use triamcinolone 0.1% ointment twice a day as needed for eczema flares. Do not use on the face, neck, armpits or groin area. Do not use more than 3 weeks in a row.  . Moisturizer: Triamcinolone-Eucerin  o Try to use every other day and see if that controls symptoms.  . For more than twice a day use the following: Aquaphor, Vaseline, Cerave, Cetaphil, Eucerin, Vanicream.  Itching: . Take loratadine 10mg  in the morning . Take hydroxyzine 10-20mg  1 hour before bed  Seasonal and perennial allergic rhinitis Past history - 2022 skin testing showed: Positive to grass, weed pollen, trees, mold, dust mites, cat, dog, cockroach, horse, ragweed. Borderline to mouse. Interim history - rhinorrhea.  Continue environmental control measures as below.  May use over the counter antihistamines such as Zyrtec (cetirizine), Claritin (loratadine), Allegra (fexofenadine), or Xyzal (levocetirizine) daily as needed.  May use Flonase (fluticasone) nasal spray 1 spray per nostril twice a day as needed for nasal congestion.   Anaphylactic reaction due to other food products, subsequent encounter Past history - Throat tightness/closure after peanut exposure in the past. Avoiding all peanuts and tree nuts. 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 2022 skin testing showed: Positive to peanuts, wheat, casein, fish and cashew.   Okay to eat wheat and fish as before.   Continue strict avoidance of peanuts, tree nuts.   For mild symptoms you can take over the counter antihistamines such as Benadryl  and monitor symptoms closely. If symptoms worsen or if you have severe symptoms including breathing issues, throat closure, significant swelling, whole body hives, severe diarrhea and vomiting, lightheadedness then inject epinephrine and seek immediate medical care afterwards.  Food action plan in  place.   History of asthma Past history - Asthma as a child. No inhaler use for the past few years.  Monitor symptoms.  Return in about 6 months (around 05/09/2021).  Meds ordered this encounter  Medications  . fluticasone (FLONASE) 50 MCG/ACT nasal spray    Sig: Place 1 spray into both nostrils 2 (two) times daily as needed for rhinitis.    Dispense:  16 g    Refill:  5   Lab Orders  No laboratory test(s) ordered today    Diagnostics: None.  Medication List:  Current Outpatient Medications  Medication Sig Dispense Refill  . albuterol (PROVENTIL HFA;VENTOLIN HFA) 108 (90 BASE) MCG/ACT inhaler Inhale 1-2 puffs into the lungs every 6 (six) hours as needed for wheezing. 1 Inhaler 1  . EPINEPHrine 0.3 mg/0.3 mL IJ SOAJ injection See admin instructions.    Marland Kitchen escitalopram (LEXAPRO) 10 MG tablet Take 10 mg by mouth daily.    . fluticasone (FLONASE) 50 MCG/ACT nasal spray Place 1 spray into both nostrils 2 (two) times daily as needed for rhinitis. 16 g 5  . hydrOXYzine (ATARAX/VISTARIL) 10 MG tablet Take 1 to 2 tablets 1 hour before bedtime as needed for itching 60 tablet 2  . ibuprofen (ADVIL,MOTRIN) 200 MG tablet Take 200 mg by mouth every 6 (six) hours as needed for fever or moderate pain.    Marland Kitchen loratadine (CLARITIN) 10 MG tablet Take 10 mg by mouth daily.    . pimecrolimus (ELIDEL) 1 % cream Apply topically 2 (two) times daily as needed (mild to moderate eczema flares). 60 g 1  . triamcinolone 0.1% oint-Eucerin equivalent cream 1:1 mixture See admin instructions.    . triamcinolone ointment (KENALOG) 0.1 % Apply 1 application topically 2 (two) times daily. Use daily as moisturizer 453 g 2   No current facility-administered medications for this visit.   Allergies: Allergies  Allergen Reactions  . Peanut-Containing Drug Products    I reviewed his past medical history, social history, family history, and environmental history and no significant changes have been reported from his  previous visit.  Review of Systems  Constitutional: Negative for appetite change, chills, fever and unexpected weight change.  HENT: Positive for rhinorrhea. Negative for congestion.   Eyes: Negative for itching.  Respiratory: Negative for cough, chest tightness, shortness of breath and wheezing.   Cardiovascular: Negative for chest pain.  Gastrointestinal: Negative for abdominal pain.  Genitourinary: Negative for difficulty urinating.  Skin: Positive for rash.  Allergic/Immunologic: Positive for environmental allergies and food allergies.  Neurological: Negative for headaches.   Objective: BP 102/74   Pulse (!) 118   Temp 98 F (36.7 C) (Temporal)   Resp 16   SpO2 96%  There is no height or weight on file to calculate BMI. Physical Exam Vitals and nursing note reviewed.  Constitutional:      Appearance: Normal appearance. He is well-developed.  HENT:     Head: Normocephalic and atraumatic.     Right Ear: Tympanic membrane and external ear normal.     Left Ear: Tympanic membrane and external ear normal.     Nose: Rhinorrhea present.     Mouth/Throat:     Mouth: Mucous membranes are moist.     Pharynx: Oropharynx  is clear.  Eyes:     Conjunctiva/sclera: Conjunctivae normal.  Cardiovascular:     Rate and Rhythm: Normal rate and regular rhythm.     Heart sounds: Normal heart sounds. No murmur heard. No friction rub. No gallop.   Pulmonary:     Effort: Pulmonary effort is normal.     Breath sounds: Normal breath sounds. No wheezing, rhonchi or rales.  Musculoskeletal:     Cervical back: Neck supple.  Skin:    General: Skin is warm.     Findings: Rash present.     Comments: Dry erythematous patch on right forearm area. Skin much improved.  Neurological:     Mental Status: He is alert and oriented to person, place, and time.  Psychiatric:        Behavior: Behavior normal.    Previous notes and tests were reviewed. The plan was reviewed with the patient/family, and  all questions/concerned were addressed.  It was my pleasure to see Sullivan today and participate in his care. Please feel free to contact me with any questions or concerns.  Sincerely,  Wyline Mood, DO Allergy & Immunology  [facility] office: [phone] [facility] office: [phone]

## 2020-11-06 NOTE — Assessment & Plan Note (Addendum)
Past history - Throat tightness/closure after peanut exposure in the past. Avoiding all peanuts and tree nuts. 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 2022 skin testing showed: Positive to peanuts, wheat, casein, fish and cashew.   Okay to eat wheat and fish as before.   Continue strict avoidance of peanuts, tree nuts.   For mild symptoms you can take over the counter antihistamines such as Benadryl and monitor symptoms closely. If symptoms worsen or if you have severe symptoms including breathing issues, throat closure, significant swelling, whole body hives, severe diarrhea and vomiting, lightheadedness then inject epinephrine and seek immediate medical care afterwards.  Food action plan in place.

## 2020-11-09 ENCOUNTER — Other Ambulatory Visit: Payer: Self-pay

## 2020-11-09 MED ORDER — HYDROXYZINE HCL 10 MG PO TABS
ORAL_TABLET | ORAL | 0 refills | Status: AC
Start: 2020-11-09 — End: ?

## 2021-05-08 NOTE — Progress Notes (Signed)
Follow Up Note  RE: Adrian Lester MRN: [id number] DOB: May 30, 2001 Date of Office Visit: 05/09/2021  Referring provider: No ref. provider found Primary care provider: Pcp, No  Chief Complaint: Eczema (Has been doing better since last year ) and Asthma (ACT - 25 Has not needed rescue inhaler )  History of Present Illness: I had the pleasure of seeing Adrian Lester for a follow up visit at the [facility] on 05/09/2021. He is a 20 y.o. male, who is being followed for atopic dermatitis, allergic rhinitis and food allergy. His previous allergy office visit was on 11/06/2020 with Dr. Selena Batten. Today is a regular follow up visit.  Atopic dermatitis Doing better - still has some spots on the hands. Currently moisturizing daily.  Uses triamcinolone/Eucerin daily - he was able to come off daily use during the warmer months.  Used Elidel and triamcinolone prn with good benefit.  Takes hydroxyzine as needed once about every 1-2 weeks with good benefit for the itching.    Seasonal and perennial allergic rhinitis No nasal spray use.  Some coughing lately with the change of season and itchy throat with rhinorrhea.  Not taking any antihistamines for this.  Food allergy Currently avoiding peanuts, tree nuts and no reactions. Limiting gluten intake and rarely eating fish.   Assessment and Plan: Adrian Lester is a 20 y.o. male with: Other atopic dermatitis Past history - eczema as a child which flared significantly 2-3 months ago requiring 3 total courses of prednisone. Denies any changes in diet, meds or personal care products. They did get a new dog in December 2021.  Interim history - controlled with below regimen. Continue proper skin care. Will hold off starting Dupixent.  Medications: Only apply to affected areas that are "rough and red" Face: May use pimecrolimus (Elidel) twice a day on mild eczema flares on the face and body. This is a non-steroid ointment.  Body:  May use  triamcinolone 0.1% ointment twice a day as needed for eczema flares. Do not use on the face, neck, armpits or groin area. Do not use more than 3 weeks in a row.  Moisturizer: Triamcinolone-Eucerin  Try to use every other day and see if that controls symptoms.  For more than twice a day use the following: Aquaphor, Vaseline, Cerave, Cetaphil, Eucerin, Vanicream.  Itching: Take loratadine 10mg  in the morning as needed. Take hydroxyzine 10-20mg  1 hour before bed as needed.  Seasonal and perennial allergic rhinitis Past history - 2022 skin testing showed: Positive to grass, weed pollen, trees, mold, dust mites, cat, dog, cockroach, horse, ragweed. Borderline to mouse. Interim history - rhinorrhea and coughing lately but not taking any meds. Continue environmental control measures as below. May use over the counter antihistamines such as Zyrtec (cetirizine), Claritin (loratadine), Allegra (fexofenadine), or Xyzal (levocetirizine) daily as needed. May use Flonase (fluticasone) nasal spray 1 spray per nostril twice a day as needed for nasal congestion.   Anaphylactic reaction due to other food products, subsequent encounter Past history - Throat tightness/closure after peanut exposure in the past. Avoiding all peanuts and tree nuts. 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 2022 skin testing showed: Positive to peanuts, wheat, casein, fish and cashew.  Interim history - no issues with limited wheat and fish. Continue strict avoidance of peanuts, tree nuts.  For mild symptoms you can take over the counter antihistamines such as Benadryl and monitor symptoms closely. If symptoms worsen or if you have severe symptoms including breathing  issues, throat closure, significant swelling, whole body hives, severe diarrhea and vomiting, lightheadedness then inject epinephrine and seek immediate medical care afterwards. Food action plan in place.   History of asthma Past history - Asthma as a child. No  inhaler use for the past few years. Monitor symptoms.  Return in about 6 months (around 11/06/2021).  Meds ordered this encounter  Medications   triamcinolone ointment (KENALOG) 0.1 %    Sig: Apply 1 application topically 2 (two) times daily. Use daily as moisturizer    Dispense:  453 g    Refill:  2    Mix 1:1 with Eucerin.    Lab Orders  No laboratory test(s) ordered today    Diagnostics: None.   Medication List:  Current Outpatient Medications  Medication Sig Dispense Refill   albuterol (PROVENTIL HFA;VENTOLIN HFA) 108 (90 BASE) MCG/ACT inhaler Inhale 1-2 puffs into the lungs every 6 (six) hours as needed for wheezing. 1 Inhaler 1   EPINEPHrine 0.3 mg/0.3 mL IJ SOAJ injection See admin instructions.     escitalopram (LEXAPRO) 10 MG tablet Take 10 mg by mouth daily.     fluticasone (FLONASE) 50 MCG/ACT nasal spray Place 1 spray into both nostrils 2 (two) times daily as needed for rhinitis. 16 g 5   hydrOXYzine (ATARAX/VISTARIL) 10 MG tablet Take 1 to 2 tablets 1 hour before bedtime as needed for itching 180 tablet 0   ibuprofen (ADVIL,MOTRIN) 200 MG tablet Take 200 mg by mouth every 6 (six) hours as needed for fever or moderate pain.     loratadine (CLARITIN) 10 MG tablet Take 10 mg by mouth daily.     pimecrolimus (ELIDEL) 1 % cream Apply topically 2 (two) times daily as needed (mild to moderate eczema flares). 60 g 1   triamcinolone 0.1% oint-Eucerin equivalent cream 1:1 mixture See admin instructions.     triamcinolone ointment (KENALOG) 0.1 % Apply 1 application topically 2 (two) times daily. Use daily as moisturizer 453 g 2   No current facility-administered medications for this visit.   Allergies: Allergies  Allergen Reactions   Peanut-Containing Drug Products    I reviewed his past medical history, social history, family history, and environmental history and no significant changes have been reported from his previous visit.  Review of Systems  Constitutional:   Negative for appetite change, chills, fever and unexpected weight change.  HENT:  Positive for rhinorrhea. Negative for congestion.   Eyes:  Negative for itching.  Respiratory:  Negative for cough, chest tightness, shortness of breath and wheezing.   Cardiovascular:  Negative for chest pain.  Gastrointestinal:  Negative for abdominal pain.  Genitourinary:  Negative for difficulty urinating.  Skin:  Positive for rash.  Allergic/Immunologic: Positive for environmental allergies and food allergies.  Neurological:  Negative for headaches.   Objective: BP 116/76   Pulse 84   Temp 98.6 F (37 C)   Resp 18   Ht 6\' 1"  (1.854 m)   Wt 184 lb 12.8 oz (83.8 kg)   SpO2 97%   BMI 24.38 kg/m  Body mass index is 24.38 kg/m. Physical Exam Vitals and nursing note reviewed.  Constitutional:      Appearance: Normal appearance. He is well-developed.  HENT:     Head: Normocephalic and atraumatic.     Right Ear: Tympanic membrane and external ear normal.     Left Ear: Tympanic membrane and external ear normal.     Nose: Nose normal.     Mouth/Throat:  Mouth: Mucous membranes are moist.     Pharynx: Oropharynx is clear.  Eyes:     Conjunctiva/sclera: Conjunctivae normal.  Cardiovascular:     Rate and Rhythm: Normal rate and regular rhythm.     Heart sounds: Normal heart sounds. No murmur heard.   No friction rub. No gallop.  Pulmonary:     Effort: Pulmonary effort is normal.     Breath sounds: Normal breath sounds. No wheezing, rhonchi or rales.  Musculoskeletal:     Cervical back: Neck supple.  Skin:    General: Skin is warm.     Findings: Rash present.     Comments: Small dry erythematous patch on right dorsal hand area.   Neurological:     Mental Status: He is alert and oriented to person, place, and time.  Psychiatric:        Behavior: Behavior normal.   Previous notes and tests were reviewed. The plan was reviewed with the patient/family, and all questions/concerned were  addressed.  It was my pleasure to see Adrian Lester today and participate in his care. Please feel free to contact me with any questions or concerns.  Sincerely,  Wyline Mood, DO Allergy & Immunology  [facility] office: [phone] [facility] office: [phone]

## 2021-05-09 ENCOUNTER — Ambulatory Visit: Payer: BC Managed Care – PPO | Admitting: Allergy

## 2021-05-09 ENCOUNTER — Other Ambulatory Visit: Payer: Self-pay

## 2021-05-09 ENCOUNTER — Encounter: Payer: Self-pay | Admitting: Allergy

## 2021-05-09 VITALS — BP 116/76 | HR 84 | Temp 98.6°F | Resp 18 | Ht 73.0 in | Wt 184.8 lb

## 2021-05-09 DIAGNOSIS — T7800XD Anaphylactic reaction due to unspecified food, subsequent encounter: Secondary | ICD-10-CM

## 2021-05-09 DIAGNOSIS — Z8709 Personal history of other diseases of the respiratory system: Secondary | ICD-10-CM | POA: Diagnosis not present

## 2021-05-09 DIAGNOSIS — T7809XD Anaphylactic reaction due to other food products, subsequent encounter: Secondary | ICD-10-CM

## 2021-05-09 DIAGNOSIS — L2089 Other atopic dermatitis: Secondary | ICD-10-CM

## 2021-05-09 DIAGNOSIS — J3089 Other allergic rhinitis: Secondary | ICD-10-CM

## 2021-05-09 DIAGNOSIS — J302 Other seasonal allergic rhinitis: Secondary | ICD-10-CM

## 2021-05-09 MED ORDER — TRIAMCINOLONE ACETONIDE 0.1 % EX OINT: 1.0000 | TOPICAL_OINTMENT | Freq: Two times a day (BID) | CUTANEOUS | 2 refills | Status: DC

## 2021-05-09 NOTE — Assessment & Plan Note (Signed)
Past history - eczema as a child which flared significantly 2-3 months ago requiring 3 total courses of prednisone. Denies any changes in diet, meds or personal care products. They did get a new dog in December 2021.  Interim history - controlled with below regimen.  Continue proper skin care. Will hold off starting Dupixent.  Medications: . Only apply to affected areas that are "rough and red" . Face: May use pimecrolimus (Elidel) twice a day on mild eczema flares on the face and body. This is a non-steroid ointment.  Body:  . May use triamcinolone 0.1% ointment twice a day as needed for eczema flares. Do not use on the face, neck, armpits or groin area. Do not use more than 3 weeks in a row.  . Moisturizer: Triamcinolone-Eucerin  o Try to use every other day and see if that controls symptoms.  . For more than twice a day use the following: Aquaphor, Vaseline, Cerave, Cetaphil, Eucerin, Vanicream.  Itching: . Take loratadine 10mg  in the morning as needed. . Take hydroxyzine 10-20mg  1 hour before bed as needed.

## 2021-05-09 NOTE — Assessment & Plan Note (Signed)
Past history - Throat tightness/closure after peanut exposure in the past. Avoiding all peanuts and tree nuts. 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 2022 skin testing showed: Positive to peanuts, wheat, casein, fish and cashew.  Interim history - no issues with limited wheat and fish.  Continue strict avoidance of peanuts, tree nuts.   For mild symptoms you can take over the counter antihistamines such as Benadryl and monitor symptoms closely. If symptoms worsen or if you have severe symptoms including breathing issues, throat closure, significant swelling, whole body hives, severe diarrhea and vomiting, lightheadedness then inject epinephrine and seek immediate medical care afterwards.  Food action plan in place.

## 2021-05-09 NOTE — Patient Instructions (Addendum)
Skin:  Continue proper skin care. Medications: Only apply to affected areas that are "rough and red" Face: May use pimecrolimus (Elidel) twice a day on mild eczema flares on the face and body. This is a non-steroid ointment.  Body:  May use triamcinolone 0.1% ointment twice a day as needed for eczema flares. Do not use on the face, neck, armpits or groin area. Do not use more than 3 weeks in a row.  Moisturizer: Triamcinolone-Eucerin  Try to use every other day and see if that controls symptoms.  For more than twice a day use the following: Aquaphor, Vaseline, Cerave, Cetaphil, Eucerin, Vanicream.  Itching: Take loratadine 10mg  in the morning Take hydroxyzine 10-20mg  1 hour before bed  Environmental allergies 2022 skin testing positive to grass, weed pollen, trees, mold, dust mites, cat, dog, cockroach, horse, ragweed. Borderline to mouse Continue environmental control measures as below. May use over the counter antihistamines such as Zyrtec (cetirizine), Claritin (loratadine), Allegra (fexofenadine), or Xyzal (levocetirizine) daily as needed. May use Flonase (fluticasone) nasal spray 1 spray per nostril twice a day as needed for nasal congestion.   Food: 2022 skin testing positive to peanuts, wheat, casein, fish and cashew. Continue strict avoidance of peanuts, tree nuts.  For mild symptoms you can take over the counter antihistamines such as Benadryl and monitor symptoms closely. If symptoms worsen or if you have severe symptoms including breathing issues, throat closure, significant swelling, whole body hives, severe diarrhea and vomiting, lightheadedness then inject epinephrine and seek immediate medical care afterwards. Food action plan in place.   Follow up in 6 months or sooner if needed.    Skin care recommendations  Bath time: Always use lukewarm water. AVOID very hot or cold water. Keep bathing time to 5-10 minutes. Do NOT use bubble bath. Use a mild soap and use just  enough to wash the dirty areas. Do NOT scrub skin vigorously.  After bathing, pat dry your skin with a towel. Do NOT rub or scrub the skin.  Moisturizers and prescriptions:  ALWAYS apply moisturizers immediately after bathing (within 3 minutes). This helps to lock-in moisture. Use the moisturizer several times a day over the whole body. Good summer moisturizers include: Aveeno, CeraVe, Cetaphil. Good winter moisturizers include: Aquaphor, Vaseline, Cerave, Cetaphil, Eucerin, Vanicream. When using moisturizers along with medications, the moisturizer should be applied about one hour after applying the medication to prevent diluting effect of the medication or moisturize around where you applied the medications. When not using medications, the moisturizer can be continued twice daily as maintenance.  Laundry and clothing: Avoid laundry products with added color or perfumes. Use unscented hypo-allergenic laundry products such as Tide free, Cheer free & gentle, and All free and clear.  If the skin still seems dry or sensitive, you can try double-rinsing the clothes. Avoid tight or scratchy clothing such as wool. Do not use fabric softeners or dyer sheets.  Reducing Pollen Exposure Pollen seasons: trees (spring), grass (summer) and ragweed/weeds (fall). Keep windows closed in your home and car to lower pollen exposure.  Install air conditioning in the bedroom and throughout the house if possible.  Avoid going out in dry windy days - especially early morning. Pollen counts are highest between 5 - 10 AM and on dry, hot and windy days.  Save outside activities for late afternoon or after a heavy rain, when pollen levels are lower.  Avoid mowing of grass if you have grass pollen allergy. Be aware that pollen can also be transported  indoors on people and pets.  Dry your clothes in an automatic dryer rather than hanging them outside where they might collect pollen.  Rinse hair and eyes before  bedtime. Mold Control Mold and fungi can grow on a variety of surfaces provided certain temperature and moisture conditions exist.  Outdoor molds grow on plants, decaying vegetation and soil. The major outdoor mold, Alternaria and Cladosporium, are found in very high numbers during hot and dry conditions. Generally, a late summer - fall peak is seen for common outdoor fungal spores. Rain will temporarily lower outdoor mold spore count, but counts rise rapidly when the rainy period ends. The most important indoor molds are Aspergillus and Penicillium. Dark, humid and poorly ventilated basements are ideal sites for mold growth. The next most common sites of mold growth are the bathroom and the kitchen. Outdoor (Seasonal) Mold Control Use air conditioning and keep windows closed. Avoid exposure to decaying vegetation. Avoid leaf raking. Avoid grain handling. Consider wearing a face mask if working in moldy areas.  Indoor (Perennial) Mold Control  Maintain humidity below 50%. Get rid of mold growth on hard surfaces with water, detergent and, if necessary, 5% bleach (do not mix with other cleaners). Then dry the area completely. If mold covers an area more than 10 square feet, consider hiring an indoor environmental professional. For clothing, washing with soap and water is best. If moldy items cannot be cleaned and dried, throw them away. Remove sources e.g. contaminated carpets. Repair and seal leaking roofs or pipes. Using dehumidifiers in damp basements may be helpful, but empty the water and clean units regularly to prevent mildew from forming. All rooms, especially basements, bathrooms and kitchens, require ventilation and cleaning to deter mold and mildew growth. Avoid carpeting on concrete or damp floors, and storing items in damp areas. Control of House Dust Mite Allergen Dust mite allergens are a common trigger of allergy and asthma symptoms. While they can be found throughout the house, these  microscopic creatures thrive in warm, humid environments such as bedding, upholstered furniture and carpeting. Because so much time is spent in the bedroom, it is essential to reduce mite levels there.  Encase pillows, mattresses, and box springs in special allergen-proof fabric covers or airtight, zippered plastic covers.  Bedding should be washed weekly in hot water (130 F) and dried in a hot dryer. Allergen-proof covers are available for comforters and pillows that can't be regularly washed.  Wash the allergy-proof covers every few months. Minimize clutter in the bedroom. Keep pets out of the bedroom.  Keep humidity less than 50% by using a dehumidifier or air conditioning. You can buy a humidity measuring device called a hygrometer to monitor this.  If possible, replace carpets with hardwood, linoleum, or washable area rugs. If that's not possible, vacuum frequently with a vacuum that has a HEPA filter. Remove all upholstered furniture and non-washable window drapes from the bedroom. Remove all non-washable stuffed toys from the bedroom.  Wash stuffed toys weekly. Pet Allergen Avoidance: Contrary to popular opinion, there are no "hypoallergenic" breeds of dogs or cats. That is because people are not allergic to an animal's hair, but to an allergen found in the animal's saliva, dander (dead skin flakes) or urine. Pet allergy symptoms typically occur within minutes. For some people, symptoms can build up and become most severe 8 to 12 hours after contact with the animal. People with severe allergies can experience reactions in public places if dander has been transported on the pet  owners' clothing. Keeping an animal outdoors is only a partial solution, since homes with pets in the yard still have higher concentrations of animal allergens. Before getting a pet, ask your allergist to determine if you are allergic to animals. If your pet is already considered part of your family, try to minimize contact  and keep the pet out of the bedroom and other rooms where you spend a great deal of time. As with dust mites, vacuum carpets often or replace carpet with a hardwood floor, tile or linoleum. High-efficiency particulate air (HEPA) cleaners can reduce allergen levels over time. While dander and saliva are the source of cat and dog allergens, urine is the source of allergens from rabbits, hamsters, mice and [place] pigs; so ask a non-allergic family member to clean the animal's cage. If you have a pet allergy, talk to your allergist about the potential for allergy immunotherapy (allergy shots). This strategy can often provide long-term relief. Cockroach Allergen Avoidance Cockroaches are often found in the homes of densely populated urban areas, schools or commercial buildings, but these creatures can lurk almost anywhere. This does not mean that you have a dirty house or living area. Block all areas where roaches can enter the home. This includes crevices, wall cracks and windows.  Cockroaches need water to survive, so fix and seal all leaky faucets and pipes. Have an exterminator go through the house when your family and pets are gone to eliminate any remaining roaches. Keep food in lidded containers and put pet food dishes away after your pets are done eating. Vacuum and sweep the floor after meals, and take out garbage and recyclables. Use lidded garbage containers in the kitchen. Wash dishes immediately after use and clean under stoves, refrigerators or toasters where crumbs can accumulate. Wipe off the stove and other kitchen surfaces and cupboards regularly.

## 2021-05-09 NOTE — Assessment & Plan Note (Signed)
Past history - Asthma as a child. No inhaler use for the past few years.  Monitor symptoms.

## 2021-05-09 NOTE — Assessment & Plan Note (Signed)
Past history - 2022 skin testing showed: Positive to grass, weed pollen, trees, mold, dust mites, cat, dog, cockroach, horse, ragweed. Borderline to mouse. Interim history - rhinorrhea and coughing lately but not taking any meds.  Continue environmental control measures as below.  May use over the counter antihistamines such as Zyrtec (cetirizine), Claritin (loratadine), Allegra (fexofenadine), or Xyzal (levocetirizine) daily as needed.  May use Flonase (fluticasone) nasal spray 1 spray per nostril twice a day as needed for nasal congestion.

## 2021-11-12 ENCOUNTER — Other Ambulatory Visit: Payer: Self-pay

## 2021-11-12 ENCOUNTER — Ambulatory Visit: Payer: BC Managed Care – PPO | Admitting: Allergy

## 2021-11-12 ENCOUNTER — Encounter: Payer: Self-pay | Admitting: Allergy

## 2021-11-12 VITALS — BP 120/76 | HR 74 | Temp 98.2°F | Resp 18 | Ht 72.0 in | Wt 195.8 lb

## 2021-11-12 DIAGNOSIS — L2089 Other atopic dermatitis: Secondary | ICD-10-CM

## 2021-11-12 DIAGNOSIS — J3089 Other allergic rhinitis: Secondary | ICD-10-CM | POA: Diagnosis not present

## 2021-11-12 DIAGNOSIS — T7809XD Anaphylactic reaction due to other food products, subsequent encounter: Secondary | ICD-10-CM | POA: Diagnosis not present

## 2021-11-12 DIAGNOSIS — Z8709 Personal history of other diseases of the respiratory system: Secondary | ICD-10-CM

## 2021-11-12 DIAGNOSIS — J302 Other seasonal allergic rhinitis: Secondary | ICD-10-CM

## 2021-11-12 MED ORDER — PIMECROLIMUS 1 % EX CREA: TOPICAL_CREAM | Freq: Two times a day (BID) | CUTANEOUS | 2 refills | Status: AC | PRN

## 2021-11-12 MED ORDER — PIMECROLIMUS 1 % EX CREA: TOPICAL_CREAM | Freq: Two times a day (BID) | CUTANEOUS | 2 refills | Status: DC | PRN

## 2021-11-12 NOTE — Assessment & Plan Note (Signed)
Past history - Asthma as a child. No inhaler use for the past few years. ?Interim history - using albuterol when around the dogs too much. ?May use albuterol rescue inhaler 2 puffs every 4 to 6 hours as needed for shortness of breath, chest tightness, coughing, and wheezing. Monitor frequency of use.  ?? Get spirometry at next visit. ?

## 2021-11-12 NOTE — Assessment & Plan Note (Signed)
Past history - Throat tightness/closure after peanut exposure in the past. Avoiding all peanuts and tree nuts. 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 2022 skin testing showed: Positive to peanuts, wheat, casein, fish and cashew.  ?Interim history - allergic reaction in November which required ER visit and IM epi after eating sandwich with pesto. Didn't have his Epi with him. ?? Continue strict avoidance of peanuts, tree nuts.  ?? For mild symptoms you can take over the counter antihistamines such as Benadryl and monitor symptoms closely. If symptoms worsen or if you have severe symptoms including breathing issues, throat closure, significant swelling, whole body hives, severe diarrhea and vomiting, lightheadedness then inject epinephrine and seek immediate medical care afterwards. ?? Food action plan in place.  ?? Stressed importance of having Epinephrine device with him at all times. ?? Gave handout on where peanuts and tree nuts may be commonly founds including pesto sauce. ?

## 2021-11-12 NOTE — Assessment & Plan Note (Signed)
Past history - 2022 skin testing showed: Positive to grass, weed pollen, trees, mold, dust mites, cat, dog, cockroach, horse, ragweed. Borderline to mouse. ?Interim history - increased symptoms since now back at home and there's a dog there.  ?? Continue environmental control measures as below. ?? May use over the counter antihistamines such as Zyrtec (cetirizine), Claritin (loratadine), Allegra (fexofenadine), or Xyzal (levocetirizine) daily as needed. ?? May use Flonase (fluticasone) nasal spray 1 spray per nostril twice a day as needed for nasal congestion.  ?? If you notice worsening symptoms while at home and around the dogs let me know.  ?

## 2021-11-12 NOTE — Patient Instructions (Addendum)
Skin:  ?Continue proper skin care. ?Try epicerum twice a day. It's a non-medicated prescription moisturizer. Sample given. ?If it works well then will send in a prescription. ? ?Medications: ?Only apply to affected areas that are ?rough and red? ?Face: May use pimecrolimus (Elidel) twice a day on mild eczema flares on the face and body. This is a non-steroid ointment.  ?Body:  ?May use triamcinolone 0.1% ointment twice a day as needed for eczema flares. Do not use on the face, neck, armpits or groin area. Do not use more than 3 weeks in a row.  ?Itching: ?Take loratadine 10mg  in the morning ?Take hydroxyzine 10-20mg  1 hour before bed ? ?Environmental allergies ?2022 skin testing positive to grass, weed pollen, trees, mold, dust mites, cat, dog, cockroach, horse, ragweed. Borderline to mouse ?Continue environmental control measures as below. ?May use over the counter antihistamines such as Zyrtec (cetirizine), Claritin (loratadine), Allegra (fexofenadine), or Xyzal (levocetirizine) daily as needed. ?May use Flonase (fluticasone) nasal spray 1 spray per nostril twice a day as needed for nasal congestion.  ?If you notice worsening symptoms while at home and around the dogs let me know.  ? ?Food: ?2022 skin testing positive to peanuts, wheat, casein, fish and cashew. ?Continue strict avoidance of peanuts, tree nuts.  ?For mild symptoms you can take over the counter antihistamines such as Benadryl and monitor symptoms closely. If symptoms worsen or if you have severe symptoms including breathing issues, throat closure, significant swelling, whole body hives, severe diarrhea and vomiting, lightheadedness then inject epinephrine and seek immediate medical care afterwards. ?Food action plan in place.  ? ?Asthma ?May use albuterol rescue inhaler 2 puffs or nebulizer every 4 to 6 hours as needed for shortness of breath, chest tightness, coughing, and wheezing. Monitor frequency of use.  ? ?Follow up in 6 months or sooner if  needed.  ? ? ?Skin care recommendations ? ?Bath time: ?Always use lukewarm water. AVOID very hot or cold water. ?Keep bathing time to 5-10 minutes. ?Do NOT use bubble bath. ?Use a mild soap and use just enough to wash the dirty areas. ?Do NOT scrub skin vigorously.  ?After bathing, pat dry your skin with a towel. Do NOT rub or scrub the skin. ? ?Moisturizers and prescriptions:  ?ALWAYS apply moisturizers immediately after bathing (within 3 minutes). This helps to lock-in moisture. ?Use the moisturizer several times a day over the whole body. ?Good summer moisturizers include: Aveeno, CeraVe, Cetaphil. ?Good winter moisturizers include: Aquaphor, Vaseline, Cerave, Cetaphil, Eucerin, Vanicream. ?When using moisturizers along with medications, the moisturizer should be applied about one hour after applying the medication to prevent diluting effect of the medication or moisturize around where you applied the medications. When not using medications, the moisturizer can be continued twice daily as maintenance. ? ?Laundry and clothing: ?Avoid laundry products with added color or perfumes. ?Use unscented hypo-allergenic laundry products such as Tide free, Cheer free & gentle, and All free and clear.  ?If the skin still seems dry or sensitive, you can try double-rinsing the clothes. ?Avoid tight or scratchy clothing such as wool. ?Do not use fabric softeners or dyer sheets. ? ?Reducing Pollen Exposure ?Pollen seasons: trees (spring), grass (summer) and ragweed/weeds (fall). ?Keep windows closed in your home and car to lower pollen exposure.  ?Install air conditioning in the bedroom and throughout the house if possible.  ?Avoid going out in dry windy days - especially early morning. ?Pollen counts are highest between 5 - 10 AM and on dry, hot  and windy days.  ?Save outside activities for late afternoon or after a heavy rain, when pollen levels are lower.  ?Avoid mowing of grass if you have grass pollen allergy. ?Be aware that  pollen can also be transported indoors on people and pets.  ?Dry your clothes in an automatic dryer rather than hanging them outside where they might collect pollen.  ?Rinse hair and eyes before bedtime. ?Mold Control ?Mold and fungi can grow on a variety of surfaces provided certain temperature and moisture conditions exist.  ?Outdoor molds grow on plants, decaying vegetation and soil. The major outdoor mold, Alternaria and Cladosporium, are found in very high numbers during hot and dry conditions. Generally, a late summer - fall peak is seen for common outdoor fungal spores. Rain will temporarily lower outdoor mold spore count, but counts rise rapidly when the rainy period ends. ?The most important indoor molds are Aspergillus and Penicillium. Dark, humid and poorly ventilated basements are ideal sites for mold growth. The next most common sites of mold growth are the bathroom and the kitchen. ?Outdoor (Seasonal) Mold Control ?Use air conditioning and keep windows closed. ?Avoid exposure to decaying vegetation. ?Avoid leaf raking. ?Avoid grain handling. ?Consider wearing a face mask if working in moldy areas.  ?Indoor (Perennial) Mold Control  ?Maintain humidity below 50%. ?Get rid of mold growth on hard surfaces with water, detergent and, if necessary, 5% bleach (do not mix with other cleaners). Then dry the area completely. If mold covers an area more than 10 square feet, consider hiring an indoor environmental professional. ?For clothing, washing with soap and water is best. If moldy items cannot be cleaned and dried, throw them away. ?Remove sources e.g. contaminated carpets. ?Repair and seal leaking roofs or pipes. Using dehumidifiers in damp basements may be helpful, but empty the water and clean units regularly to prevent mildew from forming. All rooms, especially basements, bathrooms and kitchens, require ventilation and cleaning to deter mold and mildew growth. Avoid carpeting on concrete or damp floors,  and storing items in damp areas. ?Control of House Dust Mite Allergen ?Dust mite allergens are a common trigger of allergy and asthma symptoms. While they can be found throughout the house, these microscopic creatures thrive in warm, humid environments such as bedding, upholstered furniture and carpeting. ?Because so much time is spent in the bedroom, it is essential to reduce mite levels there.  ?Encase pillows, mattresses, and box springs in special allergen-proof fabric covers or airtight, zippered plastic covers.  ?Bedding should be washed weekly in hot water (130? F) and dried in a hot dryer. Allergen-proof covers are available for comforters and pillows that can?t be regularly washed.  ?Wash the allergy-proof covers every few months. Minimize clutter in the bedroom. Keep pets out of the bedroom.  ?Keep humidity less than 50% by using a dehumidifier or air conditioning. You can buy a humidity measuring device called a hygrometer to monitor this.  ?If possible, replace carpets with hardwood, linoleum, or washable area rugs. If that's not possible, vacuum frequently with a vacuum that has a HEPA filter. ?Remove all upholstered furniture and non-washable window drapes from the bedroom. ?Remove all non-washable stuffed toys from the bedroom.  Wash stuffed toys weekly. ?Pet Allergen Avoidance: ?Contrary to popular opinion, there are no ?hypoallergenic? breeds of dogs or cats. That is because people are not allergic to an animal?s hair, but to an allergen found in the animal's saliva, dander (dead skin flakes) or urine. Pet allergy symptoms typically occur within  minutes. For some people, symptoms can build up and become most severe 8 to 12 hours after contact with the animal. People with severe allergies can experience reactions in public places if dander has been transported on the pet owners? clothing. ?Keeping an animal outdoors is only a partial solution, since homes with pets in the yard still have higher  concentrations of animal allergens. ?Before getting a pet, ask your allergist to determine if you are allergic to animals. If your pet is already considered part of your family, try to minimize contact and

## 2021-11-12 NOTE — Progress Notes (Signed)
? ?Follow Up Note ? ?RE: Adrian Lester MRN: [id number] DOB: 2001/06/08 ?Date of Office Visit: 11/12/2021 ? ?Referring provider: No ref. provider found ?Primary care provider: Pcp, No ? ?Chief Complaint: Eczema (No issues ) ? ?History of Present Illness: ?I had the pleasure of seeing Adrian Lester for a follow up visit at the [facility] on 11/12/2021. He is a 21 y.o. male, who is being followed for atopic dermatitis, allergic rhinitis, food allergy and history of asthma. His previous allergy office visit was on 05/09/2021 with Dr. Maudie Mercury. Today is a regular follow up visit. ? ?Atopic dermatitis ?Doing well and using triamcinolone ointment once every 3 days on the body and Elidel less often on the face. ? ?Taking hydroxyzine at night sometimes only. ? ?Moisturizing daily with Aveeno. ?No flare this winter. ? ?Still has spots on his hands mainly. ? ?Seasonal and perennial allergic rhinitis ?Some symptoms and taking Claritin prn. ?Now living at home where there is a dog that is not allowed in his bedroom.  ? ?Anaphylactic reaction due to other food products, subsequent encounter ?Avoiding peanuts, tree nuts.  ?Patient had pesto on a sandwich and about 1 hour afterwards he had trouble breathing. He went to the ER and required IM epi which helped within 10 minutes. He was actually not at home and forgot to take his epi with him.  ?  ?History of asthma ?No issues with his breathing other than the above. ?Used albuterol when around the dogs.  ? ?05/29/2021 ER visit: ?"HPI: Adrian Lester is a 21 y.o. male brought in by EMS for evaluation of anaphylaxis, brought in from urgent care. He has a known allergy to peanuts and states he was eating a sandwich at home and feels that the sandwich bread may have contained some knots. Soon after eating, he began feeling some nasal congestion and a tickle in his throat. He then began feeling short of breath so he presented to urgent care. He denies having had any itching  but states he was wheezing and having a hard time breathing. Per EMS notes, patient had urticaria, stridor, hypotension and hypoxia. He was given an EpiPen followed by IM Benadryl, Decadron, IV fluids, DuoNeb treatment. He was also given Pepcid with EMS. His symptoms have now resolved. He no longer feels short of breath. He no longer has constriction in his throat. He has no pruritus. He has no rash. He never had any nausea. He denies similar symptoms in the past, does have an EpiPen at home but does not usually have it with him. No other complaints. ? ?Adrian Lester is a 21 y.o. male presenting with above complaints. Patient presented to urgent care with anaphylaxis, was treated with IM epi, Benadryl, Decadron, IV fluids, DuoNeb treatment and Pepcid. Symptoms are now resolved. He is currently asymptomatic. His lungs are clear bilaterally. No wheezes or stridor noted. Vitals are stable. No evidence for angioedema at this time. We will observe here in the ED, ensure that there is no return in symptoms. If he continues to feel improved, we will discharge home with prescription for oral steroids and EpiPen. Advised that he will need to follow-up with his PCP in the next 24 to 48 hours, with strict return precautions. ? ?Progress Notes  ? ?1:56 AM PROGRESS NOTE:  ?Patient has been observed here in the ED without any worsening of symptoms. He continues asymptomatic at this time. He denies having any constriction in the throat or difficulty swallowing or  breathing. No wheezing or stridor noted. Vital signs are stable. He prefers to go home now and does not want to be further observed at this time. He states he has an EpiPen with him. I will give him a prescription for EpiPen as well as a steroid taper. Advised to call his PCP in the morning to be seen. Strict return precautions given." ? ?Assessment and Plan: ?Adrian Lester is a 21 y.o. male with: ?Anaphylactic reaction due to other food products, subsequent encounter ?Past  history - Throat tightness/closure after peanut exposure in the past. Avoiding all peanuts and tree nuts. 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 2022 skin testing showed: Positive to peanuts, wheat, casein, fish and cashew.  ?Interim history - allergic reaction in November which required ER visit and IM epi after eating sandwich with pesto. Didn't have his Epi with him. ?Continue strict avoidance of peanuts, tree nuts.  ?For mild symptoms you can take over the counter antihistamines such as Benadryl and monitor symptoms closely. If symptoms worsen or if you have severe symptoms including breathing issues, throat closure, significant swelling, whole body hives, severe diarrhea and vomiting, lightheadedness then inject epinephrine and seek immediate medical care afterwards. ?Food action plan in place.  ?Stressed importance of having Epinephrine device with him at all times. ?Gave handout on where peanuts and tree nuts may be commonly founds including pesto sauce. ? ?Other atopic dermatitis ?Past history - eczema as a child which flared significantly 2-3 months ago requiring 3 total courses of prednisone. Denies any changes in diet, meds or personal care products. They did get a new dog in December 2021.  ?Interim history - doing better with below regimen.  ?Continue proper skin care. Will hold off starting Dupixent.  ?Continue proper skin care. ?Try epicerum twice a day. It's a non-medicated prescription moisturizer. Sample given. ?If it works well then will send in a prescription. ?Medications: ?Only apply to affected areas that are ?rough and red? ?Face: May use pimecrolimus (Elidel) twice a day on mild eczema flares on the face and body. This is a non-steroid ointment.  ?Body:  ?May use triamcinolone 0.1% ointment twice a day as needed for eczema flares. Do not use on the face, neck, armpits or groin area. Do not use more than 3 weeks in a row.  ?Itching: ?Take loratadine 10mg  in the morning ?Take  hydroxyzine 10-20mg  1 hour before bed ? ?Seasonal and perennial allergic rhinitis ?Past history - 2022 skin testing showed: Positive to grass, weed pollen, trees, mold, dust mites, cat, dog, cockroach, horse, ragweed. Borderline to mouse. ?Interim history - increased symptoms since now back at home and there's a dog there.  ?Continue environmental control measures as below. ?May use over the counter antihistamines such as Zyrtec (cetirizine), Claritin (loratadine), Allegra (fexofenadine), or Xyzal (levocetirizine) daily as needed. ?May use Flonase (fluticasone) nasal spray 1 spray per nostril twice a day as needed for nasal congestion.  ?If you notice worsening symptoms while at home and around the dogs let me know.  ? ?History of asthma ?Past history - Asthma as a child. No inhaler use for the past few years. ?Interim history - using albuterol when around the dogs too much. ?May use albuterol rescue inhaler 2 puffs every 4 to 6 hours as needed for shortness of breath, chest tightness, coughing, and wheezing. Monitor frequency of use.  ?Get spirometry at next visit. ? ?Return in about 6 months (around 05/15/2022). ? ?Meds ordered this encounter  ?Medications  ? DISCONTD:  pimecrolimus (ELIDEL) 1 % cream  ?  Sig: Apply topically 2 (two) times daily as needed (mild to moderate eczema flares).  ?  Dispense:  60 g  ?  Refill:  2  ? pimecrolimus (ELIDEL) 1 % cream  ?  Sig: Apply topically 2 (two) times daily as needed (mild to moderate eczema flares).  ?  Dispense:  60 g  ?  Refill:  2  ? ?Lab Orders  ?No laboratory test(s) ordered today  ? ? ?Diagnostics: ?None.  ? ?Medication List:  ?Current Outpatient Medications  ?Medication Sig Dispense Refill  ? albuterol (PROVENTIL HFA;VENTOLIN HFA) 108 (90 BASE) MCG/ACT inhaler Inhale 1-2 puffs into the lungs every 6 (six) hours as needed for wheezing. 1 Inhaler 1  ? EPINEPHrine 0.3 mg/0.3 mL IJ SOAJ injection See admin instructions.    ? escitalopram (LEXAPRO) 10 MG tablet Take  10 mg by mouth daily.    ? fluticasone (FLONASE) 50 MCG/ACT nasal spray Place 1 spray into both nostrils 2 (two) times daily as needed for rhinitis. 16 g 5  ? hydrOXYzine (ATARAX/VISTARIL) 10 MG tablet Take 1 to

## 2021-11-12 NOTE — Assessment & Plan Note (Signed)
Past history - eczema as a child which flared significantly 2-3 months ago requiring 3 total courses of prednisone. Denies any changes in diet, meds or personal care products. They did get a new dog in December 2021.  ?Interim history - doing better with below regimen.  ?? Continue proper skin care. Will hold off starting Dupixent.  ?? Continue proper skin care. ?? Try epicerum twice a day. It's a non-medicated prescription moisturizer. Sample given. ?? If it works well then will send in a prescription. ?Medications: ?? Only apply to affected areas that are ?rough and red? ?? Face: May use pimecrolimus (Elidel) twice a day on mild eczema flares on the face and body. This is a non-steroid ointment.  ?Body:  ?? May use triamcinolone 0.1% ointment twice a day as needed for eczema flares. Do not use on the face, neck, armpits or groin area. Do not use more than 3 weeks in a row.  ?Itching: ?? Take loratadine 10mg  in the morning ?? Take hydroxyzine 10-20mg  1 hour before bed ?

## 2022-07-02 ENCOUNTER — Other Ambulatory Visit (HOSPITAL_BASED_OUTPATIENT_CLINIC_OR_DEPARTMENT_OTHER): Payer: Self-pay

## 2022-07-02 MED ORDER — LISDEXAMFETAMINE DIMESYLATE 10 MG PO CAPS
10.0000 mg | ORAL_CAPSULE | Freq: Every day | ORAL | 0 refills | Status: AC
  Filled 2022-07-02: qty 30, 30d supply, fill #0

## 2022-07-03 ENCOUNTER — Other Ambulatory Visit (HOSPITAL_BASED_OUTPATIENT_CLINIC_OR_DEPARTMENT_OTHER): Payer: Self-pay

## 2022-07-10 ENCOUNTER — Other Ambulatory Visit (HOSPITAL_BASED_OUTPATIENT_CLINIC_OR_DEPARTMENT_OTHER): Payer: Self-pay

## 2022-07-10 MED ORDER — BUPROPION HCL ER (SR) 100 MG PO TB12
100.0000 mg | ORAL_TABLET | Freq: Two times a day (BID) | ORAL | 1 refills | Status: AC
  Filled 2022-07-10: qty 60, 30d supply, fill #0

## 2022-07-10 MED ORDER — LISDEXAMFETAMINE DIMESYLATE 20 MG PO CAPS
20.0000 mg | ORAL_CAPSULE | Freq: Every day | ORAL | 0 refills | Status: AC
  Filled 2022-07-10: qty 30, 30d supply, fill #0

## 2022-07-15 ENCOUNTER — Other Ambulatory Visit: Payer: Self-pay | Admitting: *Deleted

## 2022-07-15 MED ORDER — TRIAMCINOLONE ACETONIDE 0.1 % EX OINT: 1.0000 | TOPICAL_OINTMENT | Freq: Two times a day (BID) | CUTANEOUS | 2 refills | Status: AC | PRN

## 2022-08-12 ENCOUNTER — Other Ambulatory Visit (HOSPITAL_BASED_OUTPATIENT_CLINIC_OR_DEPARTMENT_OTHER): Payer: Self-pay

## 2022-08-12 MED ORDER — LISDEXAMFETAMINE DIMESYLATE 40 MG PO CAPS
40.0000 mg | ORAL_CAPSULE | Freq: Every day | ORAL | 0 refills | Status: DC
Start: 2022-08-12 — End: 2022-10-09
  Filled 2022-08-12: qty 30, 30d supply, fill #0

## 2022-08-12 MED ORDER — BUPROPION HCL ER (SR) 100 MG PO TB12
100.0000 mg | ORAL_TABLET | Freq: Two times a day (BID) | ORAL | 2 refills | Status: AC
  Filled 2022-08-12: qty 60, 30d supply, fill #0

## 2022-08-19 ENCOUNTER — Other Ambulatory Visit (HOSPITAL_BASED_OUTPATIENT_CLINIC_OR_DEPARTMENT_OTHER): Payer: Self-pay

## 2022-10-09 ENCOUNTER — Other Ambulatory Visit: Payer: Self-pay

## 2022-10-09 ENCOUNTER — Other Ambulatory Visit (HOSPITAL_BASED_OUTPATIENT_CLINIC_OR_DEPARTMENT_OTHER): Payer: Self-pay

## 2022-10-09 MED ORDER — LISDEXAMFETAMINE DIMESYLATE 40 MG PO CAPS
40.0000 mg | ORAL_CAPSULE | Freq: Every day | ORAL | 0 refills | Status: AC
  Filled 2022-10-09: qty 60, 60d supply, fill #0

## 2022-10-09 MED ORDER — BUPROPION HCL ER (SR) 100 MG PO TB12
100.0000 mg | ORAL_TABLET | ORAL | 0 refills | Status: AC
  Filled 2022-10-09: qty 180, 90d supply, fill #0

## 2022-10-13 ENCOUNTER — Other Ambulatory Visit (HOSPITAL_BASED_OUTPATIENT_CLINIC_OR_DEPARTMENT_OTHER): Payer: Self-pay

## 2022-12-24 ENCOUNTER — Other Ambulatory Visit (HOSPITAL_BASED_OUTPATIENT_CLINIC_OR_DEPARTMENT_OTHER): Payer: Self-pay

## 2022-12-24 MED ORDER — BUPROPION HCL ER (SR) 100 MG PO TB12: 100.0000 mg | ORAL_TABLET | Freq: Two times a day (BID) | ORAL | 0 refills | Status: AC

## 2023-01-19 ENCOUNTER — Other Ambulatory Visit (HOSPITAL_BASED_OUTPATIENT_CLINIC_OR_DEPARTMENT_OTHER): Payer: Self-pay

## 2023-01-19 MED ORDER — LISDEXAMFETAMINE DIMESYLATE 40 MG PO CAPS
40.0000 mg | ORAL_CAPSULE | Freq: Every day | ORAL | 0 refills | Status: AC
  Filled 2023-01-19: qty 90, 90d supply, fill #0

## 2023-03-20 ENCOUNTER — Other Ambulatory Visit (HOSPITAL_BASED_OUTPATIENT_CLINIC_OR_DEPARTMENT_OTHER): Payer: Self-pay

## 2023-03-20 MED ORDER — AMPHETAMINE-DEXTROAMPHETAMINE 10 MG PO TABS
10.0000 mg | ORAL_TABLET | Freq: Two times a day (BID) | ORAL | 0 refills | Status: AC
  Filled 2023-03-20: qty 60, 30d supply, fill #0

## 2023-03-30 ENCOUNTER — Other Ambulatory Visit (HOSPITAL_BASED_OUTPATIENT_CLINIC_OR_DEPARTMENT_OTHER): Payer: Self-pay

## 2023-05-21 ENCOUNTER — Other Ambulatory Visit (HOSPITAL_BASED_OUTPATIENT_CLINIC_OR_DEPARTMENT_OTHER): Payer: Self-pay
